# Patient Record
Sex: Female | Born: 1960
Health system: Southern US, Community
[De-identification: ages and names within clinical notes are randomized; demographics above are authoritative.]

## PROBLEM LIST (undated history)

## (undated) DIAGNOSIS — E669 Obesity, unspecified: Secondary | ICD-10-CM

## (undated) DIAGNOSIS — N816 Rectocele: Secondary | ICD-10-CM

## (undated) DIAGNOSIS — R87610 Atypical squamous cells of undetermined significance on cytologic smear of cervix (ASC-US): Secondary | ICD-10-CM

## (undated) DIAGNOSIS — K219 Gastro-esophageal reflux disease without esophagitis: Secondary | ICD-10-CM

## (undated) DIAGNOSIS — L409 Psoriasis, unspecified: Secondary | ICD-10-CM

## (undated) DIAGNOSIS — N879 Dysplasia of cervix uteri, unspecified: Secondary | ICD-10-CM

## (undated) DIAGNOSIS — K760 Fatty (change of) liver, not elsewhere classified: Secondary | ICD-10-CM

## (undated) DIAGNOSIS — F419 Anxiety disorder, unspecified: Secondary | ICD-10-CM

## (undated) DIAGNOSIS — K5792 Diverticulitis of intestine, part unspecified, without perforation or abscess without bleeding: Secondary | ICD-10-CM

## (undated) HISTORY — DX: Atypical squamous cells of undetermined significance on cytologic smear of cervix (ASC-US): R87.610

## (undated) HISTORY — DX: Rectocele: N81.6

## (undated) HISTORY — PX: COLPOSCOPY: SHX161

## (undated) HISTORY — DX: Obesity, unspecified: E66.9

## (undated) HISTORY — DX: Anxiety disorder, unspecified: F41.9

## (undated) HISTORY — PX: COLONOSCOPY: SHX174

## (undated) HISTORY — DX: Dysplasia of cervix uteri, unspecified: N87.9

## (undated) HISTORY — PX: UPPER GASTROINTESTINAL ENDOSCOPY: SHX188

## (undated) HISTORY — DX: Diverticulitis of intestine, part unspecified, without perforation or abscess without bleeding: K57.92

## (undated) HISTORY — DX: Psoriasis, unspecified: L40.9

## (undated) HISTORY — DX: Gastro-esophageal reflux disease without esophagitis: K21.9

## (undated) HISTORY — PX: ROOT CANAL: SHX2363

## (undated) HISTORY — DX: Fatty (change of) liver, not elsewhere classified: K76.0

---

## 1996-02-12 ENCOUNTER — Encounter: Payer: Self-pay | Admitting: Gastroenterology

## 1998-11-17 ENCOUNTER — Other Ambulatory Visit: Admission: RE | Admit: 1998-11-17 | Discharge: 1998-11-17 | Payer: Self-pay | Admitting: Gynecology

## 2000-02-12 ENCOUNTER — Other Ambulatory Visit: Admission: RE | Admit: 2000-02-12 | Discharge: 2000-02-12 | Payer: Self-pay | Admitting: Gynecology

## 2001-02-18 ENCOUNTER — Other Ambulatory Visit: Admission: RE | Admit: 2001-02-18 | Discharge: 2001-02-18 | Payer: Self-pay | Admitting: Gynecology

## 2001-03-25 ENCOUNTER — Other Ambulatory Visit: Admission: RE | Admit: 2001-03-25 | Discharge: 2001-03-25 | Payer: Self-pay | Admitting: Gynecology

## 2001-03-25 ENCOUNTER — Encounter (INDEPENDENT_AMBULATORY_CARE_PROVIDER_SITE_OTHER): Payer: Self-pay | Admitting: Specialist

## 2002-03-12 ENCOUNTER — Other Ambulatory Visit: Admission: RE | Admit: 2002-03-12 | Discharge: 2002-03-12 | Payer: Self-pay | Admitting: Gynecology

## 2003-04-26 ENCOUNTER — Other Ambulatory Visit: Admission: RE | Admit: 2003-04-26 | Discharge: 2003-04-26 | Payer: Self-pay | Admitting: Gynecology

## 2004-04-26 ENCOUNTER — Other Ambulatory Visit: Admission: RE | Admit: 2004-04-26 | Discharge: 2004-04-26 | Payer: Self-pay | Admitting: Gynecology

## 2005-04-30 ENCOUNTER — Other Ambulatory Visit: Admission: RE | Admit: 2005-04-30 | Discharge: 2005-04-30 | Payer: Self-pay | Admitting: Gynecology

## 2006-05-09 ENCOUNTER — Other Ambulatory Visit: Admission: RE | Admit: 2006-05-09 | Discharge: 2006-05-09 | Payer: Self-pay | Admitting: Gynecology

## 2007-05-15 ENCOUNTER — Other Ambulatory Visit: Admission: RE | Admit: 2007-05-15 | Discharge: 2007-05-15 | Payer: Self-pay | Admitting: Gynecology

## 2008-05-18 ENCOUNTER — Other Ambulatory Visit: Admission: RE | Admit: 2008-05-18 | Discharge: 2008-05-18 | Payer: Self-pay | Admitting: Gynecology

## 2008-11-17 ENCOUNTER — Ambulatory Visit: Payer: Self-pay | Admitting: Gynecology

## 2008-11-17 ENCOUNTER — Encounter: Payer: Self-pay | Admitting: Gynecology

## 2008-11-17 ENCOUNTER — Other Ambulatory Visit: Admission: RE | Admit: 2008-11-17 | Discharge: 2008-11-17 | Payer: Self-pay | Admitting: Gynecology

## 2009-05-24 ENCOUNTER — Other Ambulatory Visit: Admission: RE | Admit: 2009-05-24 | Discharge: 2009-05-24 | Payer: Self-pay | Admitting: Gynecology

## 2009-05-24 ENCOUNTER — Encounter: Payer: Self-pay | Admitting: Gastroenterology

## 2009-05-24 ENCOUNTER — Encounter: Payer: Self-pay | Admitting: Gynecology

## 2009-05-24 ENCOUNTER — Ambulatory Visit: Payer: Self-pay | Admitting: Gynecology

## 2009-05-31 ENCOUNTER — Ambulatory Visit: Payer: Self-pay | Admitting: Gynecology

## 2009-05-31 ENCOUNTER — Encounter: Payer: Self-pay | Admitting: Gastroenterology

## 2009-07-15 DIAGNOSIS — K219 Gastro-esophageal reflux disease without esophagitis: Secondary | ICD-10-CM | POA: Insufficient documentation

## 2009-07-15 DIAGNOSIS — R740 Nonspecific elevation of levels of transaminase and lactic acid dehydrogenase [LDH]: Secondary | ICD-10-CM

## 2009-07-15 DIAGNOSIS — K589 Irritable bowel syndrome without diarrhea: Secondary | ICD-10-CM | POA: Insufficient documentation

## 2009-07-15 DIAGNOSIS — R109 Unspecified abdominal pain: Secondary | ICD-10-CM | POA: Insufficient documentation

## 2009-07-15 DIAGNOSIS — R7401 Elevation of levels of liver transaminase levels: Secondary | ICD-10-CM | POA: Insufficient documentation

## 2009-07-15 DIAGNOSIS — K649 Unspecified hemorrhoids: Secondary | ICD-10-CM | POA: Insufficient documentation

## 2009-07-15 DIAGNOSIS — K449 Diaphragmatic hernia without obstruction or gangrene: Secondary | ICD-10-CM | POA: Insufficient documentation

## 2009-07-15 DIAGNOSIS — Z87442 Personal history of urinary calculi: Secondary | ICD-10-CM | POA: Insufficient documentation

## 2009-07-18 ENCOUNTER — Ambulatory Visit: Payer: Self-pay | Admitting: Gastroenterology

## 2009-07-18 DIAGNOSIS — E669 Obesity, unspecified: Secondary | ICD-10-CM | POA: Insufficient documentation

## 2009-07-18 LAB — CONVERTED CEMR LAB
A-1 Antitrypsin, Ser: 165 mg/dL (ref 83–200)
ALT: 76 units/L — ABNORMAL HIGH (ref 0–35)
AST: 60 units/L — ABNORMAL HIGH (ref 0–37)
Albumin: 4.1 g/dL (ref 3.5–5.2)
Alkaline Phosphatase: 94 units/L (ref 39–117)
Anti Nuclear Antibody(ANA): NEGATIVE
BUN: 9 mg/dL (ref 6–23)
Basophils Absolute: 0 10*3/uL (ref 0.0–0.1)
Basophils Relative: 0.6 % (ref 0.0–3.0)
Bilirubin, Direct: 0.2 mg/dL (ref 0.0–0.3)
CO2: 31 meq/L (ref 19–32)
Calcium: 9.4 mg/dL (ref 8.4–10.5)
Ceruloplasmin: 36 mg/dL (ref 21–63)
Chloride: 105 meq/L (ref 96–112)
Cholesterol: 147 mg/dL (ref 0–200)
Creatinine, Ser: 0.6 mg/dL (ref 0.4–1.2)
Eosinophils Absolute: 0.1 10*3/uL (ref 0.0–0.7)
Eosinophils Relative: 1.7 % (ref 0.0–5.0)
Ferritin: 87.2 ng/mL (ref 10.0–291.0)
Folate: 10 ng/mL
GFR calc non Af Amer: 114.24 mL/min (ref >60)
Glucose, Bld: 97 mg/dL (ref 70–99)
HCT: 41.3 % (ref 36.0–46.0)
HDL: 39.5 mg/dL (ref >39.00)
Hemoglobin: 14.4 g/dL (ref 12.0–15.0)
Iron: 56 ug/dL (ref 42–145)
LDL Cholesterol: 96 mg/dL (ref 0–99)
Lymphocytes Relative: 28.7 % (ref 12.0–46.0)
Lymphs Abs: 1.6 10*3/uL (ref 0.7–4.0)
MCHC: 34.8 g/dL (ref 30.0–36.0)
MCV: 82.5 fL (ref 78.0–100.0)
Monocytes Absolute: 0.4 10*3/uL (ref 0.1–1.0)
Monocytes Relative: 6.6 % (ref 3.0–12.0)
Neutro Abs: 3.5 10*3/uL (ref 1.4–7.7)
Neutrophils Relative %: 62.4 % (ref 43.0–77.0)
Platelets: 161 10*3/uL (ref 150.0–400.0)
Potassium: 4.6 meq/L (ref 3.5–5.1)
RBC: 5.01 M/uL (ref 3.87–5.11)
RDW: 14.4 % (ref 11.5–14.6)
Saturation Ratios: 15.9 % — ABNORMAL LOW (ref 20.0–50.0)
Sed Rate: 17 mm/hr (ref 0–22)
Sodium: 141 meq/L (ref 135–145)
TSH: 2.16 microintl units/mL (ref 0.35–5.50)
Total Bilirubin: 1.1 mg/dL (ref 0.3–1.2)
Total CHOL/HDL Ratio: 4
Total Protein: 7.7 g/dL (ref 6.0–8.3)
Transferrin: 250.8 mg/dL (ref 212.0–360.0)
Triglycerides: 56 mg/dL (ref 0.0–149.0)
VLDL: 11.2 mg/dL (ref 0.0–40.0)
Vitamin B-12: 479 pg/mL (ref 211–911)
WBC: 5.6 10*3/uL (ref 4.5–10.5)

## 2009-07-19 ENCOUNTER — Ambulatory Visit (HOSPITAL_COMMUNITY): Admission: RE | Admit: 2009-07-19 | Discharge: 2009-07-19 | Payer: Self-pay | Admitting: Gastroenterology

## 2009-07-27 ENCOUNTER — Encounter: Payer: Self-pay | Admitting: Gastroenterology

## 2009-07-27 ENCOUNTER — Ambulatory Visit: Payer: Self-pay | Admitting: Gastroenterology

## 2009-07-29 ENCOUNTER — Encounter: Payer: Self-pay | Admitting: Gastroenterology

## 2009-08-01 ENCOUNTER — Telehealth: Payer: Self-pay | Admitting: Gastroenterology

## 2009-08-12 DIAGNOSIS — K5730 Diverticulosis of large intestine without perforation or abscess without bleeding: Secondary | ICD-10-CM | POA: Insufficient documentation

## 2009-08-12 DIAGNOSIS — K7689 Other specified diseases of liver: Secondary | ICD-10-CM | POA: Insufficient documentation

## 2009-08-16 ENCOUNTER — Ambulatory Visit: Payer: Self-pay | Admitting: Gastroenterology

## 2009-09-01 ENCOUNTER — Encounter: Admission: RE | Admit: 2009-09-01 | Discharge: 2009-11-30 | Payer: Self-pay | Admitting: Gastroenterology

## 2009-10-11 ENCOUNTER — Telehealth (INDEPENDENT_AMBULATORY_CARE_PROVIDER_SITE_OTHER): Payer: Self-pay | Admitting: *Deleted

## 2010-06-21 ENCOUNTER — Other Ambulatory Visit: Admission: RE | Admit: 2010-06-21 | Discharge: 2010-06-21 | Payer: Self-pay | Admitting: Gynecology

## 2010-06-21 ENCOUNTER — Ambulatory Visit: Payer: Self-pay | Admitting: Gynecology

## 2010-06-21 ENCOUNTER — Encounter: Payer: Self-pay | Admitting: Gastroenterology

## 2010-07-12 ENCOUNTER — Ambulatory Visit: Payer: Self-pay | Admitting: Gynecology

## 2010-09-12 ENCOUNTER — Ambulatory Visit: Payer: Self-pay | Admitting: Gynecology

## 2011-01-16 NOTE — Procedures (Signed)
Summary: EGD   EGD  Procedure date:  07/27/2009  Findings:      Location: [facility]   ENDOSCOPY PROCEDURE REPORT  PATIENT:  Grace Wells, Grace Wells  MR#:  [id number] BIRTHDATE:   1961/01/13, 48 yrs. old   GENDER:   female  ENDOSCOPIST:   Vania Rea. Jarold Motto, MD, [facility] Referred by:   PROCEDURE DATE:  07/27/2009 PROCEDURE:  EGD with biopsy ASA CLASS:   Class II INDICATIONS: chest pain, GERD   MEDICATIONS:    Versed 1 mg IV, glycopyrrolate (Robinal) 0.2 mg IV TOPICAL ANESTHETIC:   Exactacain Spray  DESCRIPTION OF PROCEDURE:   After the risks benefits and alternatives of the procedure were thoroughly explained, informed consent was obtained.  The [facility] [id number] [id number] endoscope was introduced through the mouth and advanced to the second portion of the duodenum, without limitations.  The instrument was slowly withdrawn as the mucosa was fully examined. <<PROCEDUREIMAGES>>  <<OLD IMAGES>>  Multiple erosions were found in the distal esophagus.  A hiatal hernia was found.  The stomach was entered and closely examined. The antrum, angularis, and lesser curvature were well visualized, including a retroflexed view of the cardia and fundus. The stomach wall was normally distensable. The scope passed easily through the pylorus into the duodenum.  The duodenal bulb was normal in appearance, as was the postbulbar duodenum. SMALL BOWEL BIOPSY DONE.    Retroflexed views revealed a hiatal hernia.    The scope was then withdrawn from the patient and the procedure completed.  COMPLICATIONS:   None  ENDOSCOPIC IMPRESSION:  1) Erosions, multiple in the distal esophagus  2) Hiatal hernia  3) Normal stomach  4) Normal duodenum  CHRONIC GERD. RECOMMENDATIONS:  1) await biopsy results  2) continue current medications  REPEAT EXAM:   No   _______________________________ Vania Rea. Jarold Motto, MD, [facility]    CC: Colin Broach, MD        REPORT OF SURGICAL PATHOLOGY   Case  #: [id number] Patient Name: Grace Wells, Grace Wells. Office Chart Number:  N/A MRN: [id number] Pathologist: Ferd Hibbs. Colonel Bald, MD DOB/Age  [birth year] (Age: 65)    Gender: F Date Taken:  07/27/2009 Date Received: 07/28/2009   FINAL DIAGNOSIS   ***MICROSCOPIC EXAMINATION AND DIAGNOSIS***   SMALL INTESTINE, BIOPSY:   - BENIGN SMALL BOWEL MUCOSA.   - NO VILLOUS ATROPHY, INFLAMMATION OR OTHER ABNORMALITIES PRESENT.   mw Date Reported:  07/29/2009     Ivin Booty B. Colonel Bald, MD *** Electronically Signed Out By [facility] ***     July 29, 2009 MRN: [id number]    Grace Wells [address] [place], [place]  [address]    Dear Ms. Becvar,  I am pleased to inform you that the biopsies taken during your recent endoscopic examination did not show any evidence of cancer upon pathologic examination.There is no biopsy evidence of celiac disease.  Additional information/recommendations:  __No further action is needed at this time.  Please follow-up with      your primary care physician for your other healthcare needs.  __ Please call [phone] to schedule a return visit to review      your condition.  xx__ Continue with the treatment plan as outlined on the day of your      exam.  __ You should have a repeat endoscopic examination for this problem              in _ months/years.   Please call us if you are having persistent problems or have questions  about your condition that have not been fully answered at this time.  Sincerely,  Mardella Layman MD [facility]  This letter has been electronically signed by your physician.    This report was created from the original endoscopy report, which was reviewed and signed by the above listed endoscopist.

## 2011-01-16 NOTE — Assessment & Plan Note (Signed)
Summary: elevated lfts..em   History of Present Illness Visit Type: Initial Consult Primary GI MD: Sheryn Bison MD FACP FAGA Primary Charron Coultas: Colin Broach, MD Chief Complaint: Patient here for further evaluation of increased LFT's. She complains of some occasional llq abdominal pain as well as some recent increased belching, bloating and reflux symptoms. History of Present Illness:   This patient is a 50 year old Caucasian female referred by Dr. Audie Box for abnormal liver function tests discovered on routine metabolic profile.  Grace Wells has a long history of GERD with previous endoscopy greater than 10 years ago showing erosive esophagitis. She continues with reflux symptoms of regurgitation, burning substernal chest pain, painful swallowing and intermittent solid food dysphagia. She also complains of alternating diarrhea and constipation with crampy lower bowel discomfort in both the right and left lower quadrants lasting several hours occurring very periodically without associated rectal bleeding or melena.  She had a long history of obesity and denies any specific food intolerances. She also has a long history of depression and is on daily Zoloft. She denies a history of hepatitis and recent hepatitis A ,B., and C. serologies were negative. Her SGOT was 45 and SGPT 63 with otherwise normal liver enzymes. She denies right upper quadrant pain or any hepatobiliary complaints or history of known liver disease. She denies abuse of alcohol, cigarettes, or NSAIDs.  Her family history is remarkable for multiple carcinomas with her father having had lung cancer, grandmother with possible colon cancer, and her mother with lymphoma. She did have a flexible sigmoidoscopy that was -13 years ago. I cannot see a CBC with her recent labs. She has no history of collagen vascular disease or a systemic complaints such as fever, chills, skin rashes, pruritus, chronic fatigue.   GI Review of Systems   Reports abdominal pain, chest pain, dysphagia with solids, heartburn, and  weight gain.     Location of  Abdominal pain: lower abdomen.    Denies acid reflux, belching, bloating, dysphagia with liquids, loss of appetite, nausea, vomiting, vomiting blood, and  weight loss.      Reports change in bowel habits.     Denies anal fissure, black tarry stools, constipation, diarrhea, diverticulosis, fecal incontinence, heme positive stool, hemorrhoids, irritable bowel syndrome, jaundice, light color stool, liver problems, rectal bleeding, and  rectal pain.    Current Medications (verified): 1)  Mirena 20 Mcg/24hr Iud (Levonorgestrel) .... Per Gyn 2)  Zoloft 50 Mg Tabs (Sertraline Hcl) .... Take One By Mouth Once Daily  Allergies (verified): 1)  ! Codeine 2)  ! Penicillin  Past History:  Past medical, surgical, family and social histories (including risk factors) reviewed for relevance to current acute and chronic problems.  Past Medical History: Reviewed history from 07/15/2009 and no changes required. Current Problems:  ABDOMINAL PAIN, LOWER (ICD-789.09) NEPHROLITHIASIS, HX OF (ICD-V13.01) CARCINOMA, COLON, FAMILY HX (ICD-V16.0) TRANSAMINASES, SERUM, ELEVATED (ICD-790.4) HEMORRHOIDS (ICD-455.6) IRRITABLE BOWEL SYNDROME (ICD-564.1) GERD (ICD-530.81) HIATAL HERNIA (ICD-553.3)  Past Surgical History: Unremarkable  Family History: Reviewed history from 07/15/2009 and no changes required. Family History of Colon Cancer:Paternal Grandmother  Family History of Liver Cancer:Paternal Grandmother Family History of Cervical Cancer: Sister Family History of Diabetes: Maternal grandmother Family History of Hodgkins Lymphoma: Mother  Social History: Reviewed history from 07/15/2009 and no changes required. Patient has never smoked.  Alcohol Use - no Daily Caffeine Use-3 cups daily Illicit Drug Use - no Patient does not get regular exercise.  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  Review  of Systems  The patient denies  allergy/sinus, anemia, anxiety-new, arthritis/joint pain, back pain, blood in urine, breast changes/lumps, change in vision, confusion, cough, coughing up blood, depression-new, fainting, fatigue, fever, headaches-new, hearing problems, heart murmur, heart rhythm changes, itching, menstrual pain, muscle pains/cramps, night sweats, nosebleeds, pregnancy symptoms, shortness of breath, skin rash, sleeping problems, sore throat, swelling of feet/legs, swollen lymph glands, thirst - excessive , urination - excessive , urination changes/pain, urine leakage, vision changes, and voice change.    Vital Signs:  Patient profile:   50 year old female Height:      65 inches Weight:      260.50 pounds BMI:     43.51 BSA:     2.22 Pulse rate:   72 / minute Pulse rhythm:   regular BP sitting:   126 / 90  (right arm)  Vitals Entered By: Hortense Ramal CMA Duncan Dull) (July 18, 2009 9:06 AM)  Physical Exam  General:  Well developed, well nourished, no acute distress.obese.   Head:  Normocephalic and atraumatic. Eyes:  PERRLA, no icterus.exam deferred to patient's ophthalmologist.   Neck:  Supple; no masses or thyromegaly. Lungs:  Clear throughout to auscultation. Heart:  Regular rate and rhythm; no murmurs, rubs,  or bruits. Abdomen:  Soft, nontender and nondistended. No masses, hepatosplenomegaly or hernias noted. Normal bowel sounds.obese.   Msk:  Symmetrical with no gross deformities. Normal posture. Pulses:  Normal pulses noted. Extremities:  No clubbing, cyanosis, edema or deformities noted. Neurologic:  Alert and  oriented x4;  grossly normal neurologically. Cervical Nodes:  No significant cervical adenopathy. Inguinal Nodes:  No significant inguinal adenopathy. Psych:  Alert and cooperative. Normal mood and affect.   Impression & Recommendations:  Problem # 1:  TRANSAMINASES, SERUM, ELEVATED (ICD-790.4) Assessment Unchanged Her transaminase pattern and negative  hepatitis serologies are most consistent with fatty infiltration of her liver associated with her obesity. Have scheduled her for upper abdominal ultrasound exam and also other laboratory parameters to exclude metabolic liver disease. I suspect she will need dietary referral for a scheduled patterned weight loss reduction program along with a physical exercise program. If this is not possible, consideration for bariatric surgery should be entertained since her obesity is contributing to multiple problems. Orders: TLB-CBC Platelet - w/Differential (85025-CBCD) TLB-BMP (Basic Metabolic Panel-BMET) (80048-METABOL) TLB-Hepatic/Liver Function Pnl (80076-HEPATIC) TLB-TSH (Thyroid Stimulating Hormone) (84443-TSH) TLB-B12, Serum-Total ONLY (16109-U04) TLB-Ferritin (82728-FER) TLB-Folic Acid (Folate) (82746-FOL) TLB-IBC Pnl (Iron/FE;Transferrin) (83550-IBC) TLB-Lipid Panel (80061-LIPID) TLB-Sedimentation Rate (ESR) (85652-ESR) T-Alpha-1-Antitrypsin Tot [phone]) T-AMA [phone]) T-Anti SMA (86578-46962) T-ANA (95284-13244) T-Ceruloplasmin (01027-25366) Ultrasound Abdomen (UAS) Colon/Endo (Colon/Endo)  Problem # 2:  OBESITY (ICD-278.00) Assessment: Deteriorated Currently, the patient relates that she is going to undergo dietary training and counseling at her church. If she has Elita Boone syndrome, weight loss will be important and will also consider low-dose Actos prescription and vitamin E THERAPY. D Orders: TLB-CBC Platelet - w/Differential (85025-CBCD) TLB-BMP (Basic Metabolic Panel-BMET) (80048-METABOL) TLB-Hepatic/Liver Function Pnl (80076-HEPATIC) TLB-TSH (Thyroid Stimulating Hormone) (84443-TSH) TLB-B12, Serum-Total ONLY (44034-V42) TLB-Ferritin (82728-FER) TLB-Folic Acid (Folate) (82746-FOL) TLB-IBC Pnl (Iron/FE;Transferrin) (83550-IBC) TLB-Lipid Panel (80061-LIPID) TLB-Sedimentation Rate (ESR) (85652-ESR) T-Alpha-1-Antitrypsin Tot [phone]) T-AMA [phone]) T-Anti SMA  (66063-01601) T-ANA (09323-55732) T-Ceruloplasmin (20254-27062) Ultrasound Abdomen (UAS) Colon/Endo (Colon/Endo)  Problem # 3:  ABDOMINAL PAIN, LOWER (ICD-789.09) Assessment: Deteriorated She has rather classic herbal bowel syndrome and I have given her Levsin 0.125 mg to use p.r.n. Because of her family history of colon cancer multiple carcinomas, and also scheduled colonoscopy exam at her convenience. Orders: TLB-CBC Platelet - w/Differential (85025-CBCD) TLB-BMP (Basic Metabolic Panel-BMET) (  80048-METABOL) TLB-Hepatic/Liver Function Pnl (80076-HEPATIC) TLB-TSH (Thyroid Stimulating Hormone) (84443-TSH) TLB-B12, Serum-Total ONLY (82956-O13) TLB-Ferritin (82728-FER) TLB-Folic Acid (Folate) (82746-FOL) TLB-IBC Pnl (Iron/FE;Transferrin) (83550-IBC) TLB-Lipid Panel (80061-LIPID) TLB-Sedimentation Rate (ESR) (85652-ESR) T-Alpha-1-Antitrypsin Tot [phone]) T-AMA [phone]) T-Anti SMA (40102-72536) T-ANA (64403-47425) T-Ceruloplasmin (95638-75643) Ultrasound Abdomen (UAS) Colon/Endo (Colon/Endo)  Problem # 4:  GERD (ICD-530.81) Assessment: Deteriorated She has rather impressive GERD symptoms and also painful swallowing and dysphagia suggesting ulcerative esophagitis and stricture formation. I placed her on daily PPI therapy and strict antireflux maneuvers. Obviously her obesity is contributing to this current problem. Orders: TLB-CBC Platelet - w/Differential (85025-CBCD) TLB-BMP (Basic Metabolic Panel-BMET) (80048-METABOL) TLB-Hepatic/Liver Function Pnl (80076-HEPATIC) TLB-TSH (Thyroid Stimulating Hormone) (84443-TSH) TLB-B12, Serum-Total ONLY (32951-O84) TLB-Ferritin (82728-FER) TLB-Folic Acid (Folate) (82746-FOL) TLB-IBC Pnl (Iron/FE;Transferrin) (83550-IBC) TLB-Lipid Panel (80061-LIPID) TLB-Sedimentation Rate (ESR) (85652-ESR) T-Alpha-1-Antitrypsin Tot [phone]) T-AMA [phone]) T-Anti SMA (22025-42706) T-ANA (23762-83151) T-Ceruloplasmin  (76160-73710) Ultrasound Abdomen (UAS) Colon/Endo (Colon/Endo)  Problem # 5:  CARCINOMA, COLON, FAMILY HX (ICD-V16.0) Assessment: Unchanged  Problem # 6:  NEPHROLITHIASIS, HX OF (ICD-V13.01) Assessment: Unchanged  Patient Instructions: 1)  Copy sent to : Dr. Colin Broach in gynecology 2)  Avoid foods high in acid content ( tomatoes, citrus juices, spicy foods) . Avoid eating within 3 to 4 hours of lying down or before exercising. Do not over eat; try smaller more frequent meals. Elevate head of bed four inches when sleeping.  3)  Colonoscopy and Flexible Sigmoidoscopy brochure given.  4)  Conscious Sedation brochure given.  5)  Upper Endoscopy brochure given.  6)  AcipHex 20 mg a day before breakfast 7)  P.r.n. Levsin used for IBS 8)  Upper abdominal ultrasound exam scheduled 9)  Laboratory parameters pending. 10)  Please continue current medications.  Prescriptions: ACIPHEX 20 MG  TBEC (RABEPRAZOLE SODIUM) Take 1 each day 30 minutes before meals  #30 x 3   Entered by:   Harlow Mares CMA (AAMA)   Authorized by:   Mardella Layman MD FACG,FAGA   Signed by:   Harlow Mares CMA (AAMA) on 07/18/2009   Method used:   Electronically to        [facility]  [address] [id number]       [address]       [place], [place]  [address]       Ph: [phone] or [phone]       Fax: [phone]   RxID:   [id number] MOVIPREP 100 GM  SOLR (PEG-KCL-NACL-NASULF-NA ASC-C) As per prep instructions.  #1 x 0   Entered by:   Harlow Mares CMA (AAMA)   Authorized by:   Mardella Layman MD FACG,FAGA   Signed by:   Harlow Mares CMA (AAMA) on 07/18/2009   Method used:   [facility] to        [facility]  [address] [id number]       [address]       [place], [place]  [address]       Ph: [phone] or [phone]       Fax: [phone]   RxID:   [id number] LEVSIN 0.125 MG  TABS (HYOSCYAMINE SULFATE) take one by mouth as needed  #90 x  3   Entered by:   Harlow Mares CMA (AAMA)   Authorized by:   Mardella Layman MD FACG,FAGA   Signed by:   Harlow Mares CMA (AAMA) on 07/18/2009   Method used:   [facility]  [address] [id number]       [address]       [place], [place]  [address]       Ph: [phone] or [phone]       Fax: [phone]   RxID:   [id number]

## 2011-01-16 NOTE — Procedures (Signed)
Summary: Colonoscopy   Colonoscopy  Procedure date:  07/27/2009  Findings:      [facility]    Procedures Next Due Date:    Colonoscopy: 07/2014 COLONOSCOPY PROCEDURE REPORT  PATIENT:  Grace, Wells  MR#:  [id number] BIRTHDATE:   [birth year], 48 yrs. old   GENDER:   female  ENDOSCOPIST:   Vania Rea. Jarold Motto, MD, [facility] Referred by:   PROCEDURE DATE:  07/27/2009 PROCEDURE:  Colonoscopy, diagnostic ASA CLASS:   Class II INDICATIONS: family history of colon cancer   MEDICATIONS:    Fentanyl 50 mcg IV, Versed 7 mg  DESCRIPTION OF PROCEDURE:   After the risks benefits and alternatives of the procedure were thoroughly explained, informed consent was obtained.  Digital rectal exam was performed and revealed no abnormalities.   The LB [id number] endoscope was introduced through the anus and advanced to the cecum, which was identified by both the appendix and ileocecal valve, without limitations.  The quality of the prep was excellent, using MoviPrep.  The instrument was then slowly withdrawn as the colon was fully examined. <<PROCEDUREIMAGES>>    <<OLD IMAGES>>  FINDINGS:  Moderate diverticulosis was found sigmoid to descending  No polyps or cancers were seen.  This was otherwise a normal examination of the colon.   Retroflexed views in the rectum revealed no abnormalities.    The scope was then withdrawn from the patient and the procedure completed.  COMPLICATIONS:   None  ENDOSCOPIC IMPRESSION:  1) Moderate diverticulosis in the sigmoid to descending  2) No polyps or cancers  3) Otherwise normal examination RECOMMENDATIONS:  1) high fiber diet  2) Given your significant family history of colon cancer, you should have a repeat colonoscopy in 5 years  REPEAT EXAM:   No   _______________________________ Vania Rea. Jarold Motto, MD, [facility]  CC: Colin Broach, MD

## 2011-01-16 NOTE — Letter (Signed)
Summary: Patient [facility] Biopsy Results  [facility]  [address] [place], [place] [address]   Phone: [phone]  Fax: [phone]        July 29, 2009 MRN: [id number]    JANEECE BLOK [address] [place], [place]  [address]    Dear Ms. Espino,  I am pleased to inform you that the biopsies taken during your recent endoscopic examination did not show any evidence of cancer upon pathologic examination.There is no biopsy evidence of celiac disease.  Additional information/recommendations:  __No further action is needed at this time.  Please follow-up with      your primary care physician for your other healthcare needs.  __ Please call [phone] to schedule a return visit to review      your condition.  xx__ Continue with the treatment plan as outlined on the day of your      exam.  __ You should have a repeat endoscopic examination for this problem              in _ months/years.   Please call us if you are having persistent problems or have questions about your condition that have not been fully answered at this time.  Sincerely,  Mardella Layman MD [facility]  This letter has been electronically signed by your physician.

## 2011-01-16 NOTE — Progress Notes (Signed)
Summary: ? Aciphex Reaction and Change in medication  Phone Note Call from Patient   Caller: Patient Call For: Dr Jarold Motto Summary of Call: Spoke with patient regarding her medication. She has an itchy rash and thinks it could be due to her aciphex rx. She states that she has done nothing different and is only taking the aciphex rx given to her. She has not taken any of the bentyl given to her. I have advised patient to take benadryl for the hives and to let us know immediately if symptoms become worse. She was also told to discontinue aciphex and start on omeprazole instead (her insurance will not cover aciphex-she has been taking samples provided by [place].) Initial call taken by: Hortense Ramal CMA Duncan Dull),  August 01, 2009 9:16 AM    New/Updated Medications: PRILOSEC 40 MG CPDR (OMEPRAZOLE) Take 1 tablet by mouth 30 minutes before breakfast meal. Prescriptions: PRILOSEC 40 MG CPDR (OMEPRAZOLE) Take 1 tablet by mouth 30 minutes before breakfast meal.  #30 x 6   Entered by:   Hortense Ramal CMA (AAMA)   Authorized by:   Mardella Layman MD FACG,FAGA   Signed by:   Hortense Ramal CMA (AAMA) on 08/01/2009   Method used:   [facility]  [address] [id number]       [address]       [place], [place]  [address]       Ph: [phone] or [phone]       Fax: [phone]   RxID:   [id number]

## 2011-01-16 NOTE — Progress Notes (Signed)
Summary: Needs OV  Phone Note Outgoing Call   Call placed by: Ashok Cordia RN,  October 11, 2009 9:39 AM Summary of Call: Left message for pt to call.  Needs F/U appt with liver profile 2-3 mo f/u.  Follow-up for Phone Call        Left message for pt to call. Ashok Cordia RN  October 13, 2009 10:16 AM  Letter sent.  Unable to reach pt by phone. Follow-up by: Ashok Cordia RN,  October 18, 2009 9:13 AM

## 2011-01-16 NOTE — Procedures (Signed)
Summary: Egd and Flex  Egd and Flex   Imported By: Harlow Mares CMA (AAMA) 07/15/2009 16:02:39  _____________________________________________________________________  External Attachment:    Type:   Image     Comment:   External Document

## 2011-01-16 NOTE — Assessment & Plan Note (Signed)
Summary: follow up ECL/lk   History of Present Illness Visit Type: Follow-up Visit Primary GI MD: Sheryn Bison MD FACP FAGA Primary Provider: Colin Broach, MD Chief Complaint: f/u from [facility]  No current GI complaints History of Present Illness:   Grace Wells had endoscopy showed erosive gastritis and she currently is on Prilosec 40 mg a day and is fairly asymptomatic. Colonoscopy was unremarkable. Ultrasound exam is consistent fatty infiltration of her liver and she continues with mild elevation of her liver function tests with her transaminase levels approximately twice normal. She denies a specific epidural or complaints. She has suffered from chronic obesity. Metabolic liver workup otherwise was negative. Family history is noncontributory.   GI Review of Systems      Denies abdominal pain, acid reflux, belching, bloating, chest pain, dysphagia with liquids, dysphagia with solids, heartburn, loss of appetite, nausea, vomiting, vomiting blood, weight loss, and  weight gain.        Denies anal fissure, black tarry stools, change in bowel habit, constipation, diarrhea, diverticulosis, fecal incontinence, heme positive stool, hemorrhoids, irritable bowel syndrome, jaundice, light color stool, liver problems, rectal bleeding, and  rectal pain.    Current Medications (verified): 1)  Mirena 20 Mcg/24hr Iud (Levonorgestrel) .... Per Gyn 2)  Zoloft 50 Mg Tabs (Sertraline Hcl) .... Take One By Mouth Once Daily 3)  Levsin 0.125 Mg  Tabs (Hyoscyamine Sulfate) .... Take One By Mouth As Needed 4)  Prilosec 40 Mg Cpdr (Omeprazole) .... Take 1 Tablet By Mouth 30 Minutes Before Breakfast Meal.  Allergies (verified): 1)  ! Codeine 2)  ! Penicillin  Past History:  Past medical, surgical, family and social histories (including risk factors) reviewed for relevance to current acute and chronic problems.  Past Medical History: Reviewed history from 08/12/2009 and no changes required. Current  Problems:  DIVERTICULOSIS, COLON (ICD-562.10) FATTY LIVER DISEASE (ICD-571.8) OBESITY (ICD-278.00) ABDOMINAL PAIN, LOWER (ICD-789.09) NEPHROLITHIASIS, HX OF (ICD-V13.01) CARCINOMA, COLON, FAMILY HX (ICD-V16.0) TRANSAMINASES, SERUM, ELEVATED (ICD-790.4) HEMORRHOIDS (ICD-455.6) IRRITABLE BOWEL SYNDROME (ICD-564.1) GERD (ICD-530.81) HIATAL HERNIA (ICD-553.3)  Past Surgical History: Reviewed history from 07/18/2009 and no changes required. Unremarkable  Family History: Reviewed history from 07/18/2009 and no changes required. Family History of Colon Cancer:Paternal Grandmother  Family History of Liver Cancer:Paternal Grandmother Family History of Cervical Cancer: Sister Family History of Diabetes: Maternal grandmother Family History of Hodgkins Lymphoma: Mother  Social History: Reviewed history from 07/18/2009 and no changes required. Patient has never smoked.  Alcohol Use - no Daily Caffeine Use-3 cups daily Illicit Drug Use - no Patient does not get regular exercise.  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  Review of Systems  The patient denies allergy/sinus, anemia, anxiety-new, arthritis/joint pain, back pain, blood in urine, breast changes/lumps, change in vision, confusion, cough, coughing up blood, depression-new, fainting, fatigue, fever, headaches-new, hearing problems, heart murmur, heart rhythm changes, itching, menstrual pain, muscle pains/cramps, night sweats, nosebleeds, pregnancy symptoms, shortness of breath, skin rash, sleeping problems, sore throat, swelling of feet/legs, swollen lymph glands, thirst - excessive , urination - excessive , urination changes/pain, urine leakage, vision changes, and voice change.    Vital Signs:  Patient profile:   50 year old female Height:      65 inches Weight:      259.50 pounds BMI:     43.34 Pulse rate:   70 / minute Pulse rhythm:   regular BP sitting:   110 / 66  (right arm)  Vitals Entered By: Chales Abrahams CMA Duncan Dull)  (August 16, 2009 2:43 PM)  Physical  Exam  General:  Well developed, well nourished, no acute distress.healthy appearing and obese.   Head:  Normocephalic and atraumatic. Eyes:  PERRLA, no icterus.exam deferred to patient's ophthalmologist.   Psych:  Alert and cooperative. Normal mood and affect.   Impression & Recommendations:  Problem # 1:  FATTY LIVER DISEASE (ICD-571.8) Assessment Unchanged I have referred her to [facility] nutritional center for weight loss reduction have asked her to enroll in an exercise program. Hopefully she will be able to lose 10% of her body weight and we will recheck her liver function tests. She may need vitamin E and Actos therapy. She also may need percutaneous liver biopsy to exclude Nash syndrome and cirrhosis. I will see her back in 2 months time and repeat her liver profile at that time. Orders: [facility] ([facility])  Problem # 2:  DIVERTICULOSIS, COLON (ICD-562.10) Assessment: Improved high-fiber diet as tolerated with caloric restriction.  Problem # 3:  CARCINOMA, COLON, FAMILY HX (ICD-V16.0) Assessment: Unchanged Colonoscopy every 5 years as per clinical protocol  Problem # 4:  GERD (ICD-530.81) Assessment: Improved Continue reflux regime and daily PPI therapy.  Patient Instructions: 1)  Please continue current medications.  2)  Discussed importance of regular exercise and recommended starting or continuing a regular exercise program for good health.  3)  Fatty Liver handout given.  4)  Copy sent to : Dr. Colin Broach 5)  Dietary referral for weight loss 6)  Please schedule a follow-up appointment in 2 months.  7)  Liver profile before next clinic visit 8)  Continue anti-reflex regime

## 2011-07-12 ENCOUNTER — Other Ambulatory Visit: Payer: Self-pay | Admitting: Gynecology

## 2011-07-12 ENCOUNTER — Encounter: Payer: Self-pay | Admitting: Gynecology

## 2011-07-13 ENCOUNTER — Other Ambulatory Visit: Payer: Self-pay | Admitting: *Deleted

## 2011-07-13 DIAGNOSIS — F419 Anxiety disorder, unspecified: Secondary | ICD-10-CM

## 2011-07-13 MED ORDER — SERTRALINE HCL 50 MG PO TABS: 50.0000 mg | ORAL_TABLET | Freq: Every day | ORAL | Status: DC

## 2011-07-13 NOTE — Telephone Encounter (Signed)
Needs annual exam

## 2011-07-13 NOTE — Telephone Encounter (Signed)
PT WOULD LIKE A COUPLE OF PILLS TO LAST HER UNTIL HER APPOINTMENT.IT IS ACTUALLY ON 07/17/11 RATHER THAN 08/17/11.

## 2011-07-13 NOTE — Telephone Encounter (Signed)
PT HAS APPT. ON 08/17/11. SHE IS REQUESTING 1 REFILL IF OK PLEASE E-SCRIBE.

## 2011-07-17 ENCOUNTER — Other Ambulatory Visit (HOSPITAL_COMMUNITY)
Admission: RE | Admit: 2011-07-17 | Discharge: 2011-07-17 | Disposition: A | Discharge status: Home / Self Care | Payer: Federal, State, Local not specified - PPO | Source: Ambulatory Visit | Attending: Gynecology | Admitting: Gynecology

## 2011-07-17 ENCOUNTER — Encounter: Payer: Self-pay | Admitting: Gynecology

## 2011-07-17 ENCOUNTER — Ambulatory Visit (INDEPENDENT_AMBULATORY_CARE_PROVIDER_SITE_OTHER): Payer: Federal, State, Local not specified - PPO | Admitting: Gynecology

## 2011-07-17 VITALS — BP 112/76 | Ht 65.25 in | Wt 263.0 lb

## 2011-07-17 DIAGNOSIS — F419 Anxiety disorder, unspecified: Secondary | ICD-10-CM

## 2011-07-17 DIAGNOSIS — R82998 Other abnormal findings in urine: Secondary | ICD-10-CM

## 2011-07-17 DIAGNOSIS — Z13220 Encounter for screening for lipoid disorders: Secondary | ICD-10-CM

## 2011-07-17 DIAGNOSIS — Z131 Encounter for screening for diabetes mellitus: Secondary | ICD-10-CM

## 2011-07-17 DIAGNOSIS — F411 Generalized anxiety disorder: Secondary | ICD-10-CM

## 2011-07-17 DIAGNOSIS — K759 Inflammatory liver disease, unspecified: Secondary | ICD-10-CM

## 2011-07-17 DIAGNOSIS — Z01419 Encounter for gynecological examination (general) (routine) without abnormal findings: Secondary | ICD-10-CM | POA: Insufficient documentation

## 2011-07-17 LAB — HEPATIC FUNCTION PANEL
ALT: 47 U/L — ABNORMAL HIGH (ref 0–35)
AST: 36 U/L (ref 0–37)
Albumin: 4.4 g/dL (ref 3.5–5.2)
Alkaline Phosphatase: 84 U/L (ref 39–117)
Bilirubin, Direct: 0.3 mg/dL (ref 0.0–0.3)
Indirect Bilirubin: 0.8 mg/dL (ref 0.0–0.9)
Total Bilirubin: 1.1 mg/dL (ref 0.3–1.2)
Total Protein: 7.2 g/dL (ref 6.0–8.3)

## 2011-07-17 MED ORDER — SERTRALINE HCL 50 MG PO TABS: 50.0000 mg | ORAL_TABLET | Freq: Every day | ORAL | Status: DC

## 2011-07-17 NOTE — Progress Notes (Signed)
Grace Wells 02-17-1961 [id number]   History:    50 y.o.  for annual exam overall doing well. Mirena IUD placed 7 2011 amenorrheic.  Was evaluated for mild elevated LFTs by GI she said she had endoscopy/colonoscopy but she had a followup  Lab that was normal but she's not sure.  Is taking Zoloft for a anxiety doing well with this and requests a refill.  Past medical history,surgical history, family history and social history were all reviewed and documented in the EPIC chart. ROS:  Was performed and pertinent positives and negatives are included in the history.  Exam: chaperone present Filed Vitals:   07/17/11 1105  BP: 112/76   General appearance  Normal Skin grossly normal Head/Neck normal with no cervical or supraclavicular adenopathy thyroid normal Lungs  clear Cardiac RR, without RMG Abdominal  soft, nontender, without masses, guarding, rebound or organomegaly  Breasts  examined lying and sitting without masses, retractions, discharge or axillary adenopathy. Pelvic  Ext/BUS/vagina  Normal, with mild pelvic relaxation  Cervix  Normal, IUD string visualized  Uterus  anteverted, normal size, shape and contour, midline and mobile nontender, mild uterine prolapse noted  Adnexa  Without masses or tenderness  Anus and perineum  normal   Rectovaginal  normal sphincter tone without palpated masses or tenderness. Mild rectocele noted   Assessment/Plan:  50 y.o. female for annual exam . #1 Health maintenance self breast exams on a monthly basis discussed and urged.  Had mammo this week will continue with annual mammography.  Ordered baseline CBC glucose lipid profile and urinalysis.  Using IUD doing well with this placed in July 2011 #2 History of elevated liver functions fatty liver we'll wait and recheck liver functions today as she cannot remember when she had them checked. If abnormal then we'll followup with her gastroenterologist. #3 Anxiety. Doing well with Zoloft and I refilled  her times a year. #4 Pelvic relaxation. Patient has a mild cystocele rectocele and uterine descent asymptomatic we'll continue to monitor. assuming she continues well then she will see [place] in a year sooner as needed.    Dara Lords MD, 11:52 AM 07/17/2011

## 2011-07-17 NOTE — Progress Notes (Signed)
Addended byCammie Mcgee T on: 07/17/2011 01:11 PM   Modules accepted: Orders

## 2011-07-20 ENCOUNTER — Telehealth: Payer: Self-pay | Admitting: *Deleted

## 2011-07-20 NOTE — Telephone Encounter (Signed)
LM ON PT VM WITH THE BELOW.

## 2011-07-20 NOTE — Telephone Encounter (Signed)
PT CALLING WANTING LAB RESULTS FROM OV:07/17/11.IN LIVER FUNCTION TEST THE ALT WAS ABNORMAL AT 47. WHAT SHOULD PT KNOW [id number] ABNORMAL NUMBER? PLEASE ADVISE.

## 2011-07-20 NOTE — Telephone Encounter (Signed)
Palpation I think her liver functions are essentially normal. One value is 47 upper limits of normal is 35 I think this is a marginal change.  In review of her chart her values before were 60 and 76 and now they are 36 and 47.

## 2011-12-28 ENCOUNTER — Ambulatory Visit (INDEPENDENT_AMBULATORY_CARE_PROVIDER_SITE_OTHER): Payer: Federal, State, Local not specified - PPO

## 2011-12-28 DIAGNOSIS — R5381 Other malaise: Secondary | ICD-10-CM

## 2011-12-28 DIAGNOSIS — J019 Acute sinusitis, unspecified: Secondary | ICD-10-CM

## 2012-05-12 ENCOUNTER — Ambulatory Visit (INDEPENDENT_AMBULATORY_CARE_PROVIDER_SITE_OTHER): Payer: Federal, State, Local not specified - PPO | Admitting: Family Medicine

## 2012-05-12 ENCOUNTER — Encounter: Payer: Self-pay | Admitting: Family Medicine

## 2012-05-12 VITALS — BP 131/89 | HR 73 | Temp 98.8°F | Resp 18 | Ht 65.0 in | Wt 265.8 lb

## 2012-05-12 DIAGNOSIS — R51 Headache: Secondary | ICD-10-CM

## 2012-05-12 DIAGNOSIS — J40 Bronchitis, not specified as acute or chronic: Secondary | ICD-10-CM

## 2012-05-12 MED ORDER — ALBUTEROL SULFATE HFA 108 (90 BASE) MCG/ACT IN AERS: 2.0000 | INHALATION_SPRAY | Freq: Four times a day (QID) | RESPIRATORY_TRACT | Status: DC | PRN

## 2012-05-12 MED ORDER — AZITHROMYCIN 250 MG PO TABS: ORAL_TABLET | ORAL | Status: AC

## 2012-05-12 MED ORDER — HYDROCODONE-HOMATROPINE 5-1.5 MG/5ML PO SYRP: 5.0000 mL | ORAL_SOLUTION | ORAL | Status: AC | PRN

## 2012-05-12 MED ORDER — BENZONATATE 100 MG PO CAPS: 100.0000 mg | ORAL_CAPSULE | Freq: Three times a day (TID) | ORAL | Status: AC | PRN

## 2012-05-12 MED ORDER — TRAMADOL HCL 50 MG PO TABS: 50.0000 mg | ORAL_TABLET | Freq: Four times a day (QID) | ORAL | Status: AC | PRN

## 2012-05-12 NOTE — Patient Instructions (Addendum)
Drink plenty of fluids. Get enough rest. Use the cough syrup at nighttime and cough pills in the day time. If he get worse your to return.

## 2012-05-12 NOTE — Progress Notes (Signed)
Subjective: 51 year old lady with a history of several weeks the respirator tract infection. She's had sinus had it checked now just has this persistent severe cough. Starting a fever. She is coughing up some phlegm. She does not have a history of recurrent  respiratory tract infections or annual spring-type infections. When she coughs hard she has a bad headache from right occipital area around to the right temple.  Objective: TMs are normal. Throat clear. Neck supple without significant nodes. Chest is clear to auscultation but she has a very harsh cough. Heart regular.  Assessment: Bronchitis, with component of asthmatic bronchitis  Plan: Zithromax Tessalon Albuterol inhaler Hycodan I do not feel like labs or x-rays are necessary today, but if she gets worse she is to let us

## 2012-05-14 ENCOUNTER — Encounter: Payer: Self-pay | Admitting: Family Medicine

## 2012-05-14 ENCOUNTER — Ambulatory Visit (INDEPENDENT_AMBULATORY_CARE_PROVIDER_SITE_OTHER): Payer: Federal, State, Local not specified - PPO | Admitting: Family Medicine

## 2012-05-14 ENCOUNTER — Ambulatory Visit: Payer: Federal, State, Local not specified - PPO

## 2012-05-14 VITALS — BP 140/90 | HR 79 | Temp 98.0°F | Resp 16 | Ht 66.5 in | Wt 269.0 lb

## 2012-05-14 DIAGNOSIS — R05 Cough: Secondary | ICD-10-CM

## 2012-05-14 DIAGNOSIS — J329 Chronic sinusitis, unspecified: Secondary | ICD-10-CM

## 2012-05-14 DIAGNOSIS — R059 Cough, unspecified: Secondary | ICD-10-CM

## 2012-05-14 LAB — POCT CBC
Granulocyte percent: 60 %G (ref 37–80)
HCT, POC: 42.1 % (ref 37.7–47.9)
Hemoglobin: 14.1 g/dL (ref 12.2–16.2)
Lymph, poc: 1.8 (ref 0.6–3.4)
MCH, POC: 27.8 pg (ref 27–31.2)
MCHC: 33.5 g/dL (ref 31.8–35.4)
MCV: 82.9 fL (ref 80–97)
MID (cbc): 0.4 (ref 0–0.9)
MPV: 11.2 fL (ref 0–99.8)
POC Granulocyte: 3.3 (ref 2–6.9)
POC LYMPH PERCENT: 32.2 %L (ref 10–50)
POC MID %: 7.8 %M (ref 0–12)
Platelet Count, POC: 206 10*3/uL (ref 142–424)
RBC: 5.08 M/uL (ref 4.04–5.48)
RDW, POC: 17.4 %
WBC: 5.5 10*3/uL (ref 4.6–10.2)

## 2012-05-14 MED ORDER — PREDNISONE 20 MG PO TABS: ORAL_TABLET | ORAL | Status: AC

## 2012-05-14 MED ORDER — FLUTICASONE PROPIONATE 50 MCG/ACT NA SUSP: 2.0000 | Freq: Every day | NASAL | Status: DC

## 2012-05-14 MED ORDER — LEVOFLOXACIN 500 MG PO TABS: 500.0000 mg | ORAL_TABLET | Freq: Every day | ORAL | Status: AC

## 2012-05-14 NOTE — Patient Instructions (Signed)
If not better by Friday begin the Levaquin after finishing the zithromax.

## 2012-05-14 NOTE — Progress Notes (Signed)
Subjective: Patient continues to just not feel good. It seems to move some up into her hand. She hurts in her face. Still has a headache. Not coughing up much phlegm. Throat has some discomfort but not bad ears feel like she's been in a tunnel. She does cough still. Her throat has some discomfort. Objective: TMs normal on the right. Left has a little pinhead size cystic white spot in the center of the drum, probably chronic. Her sinuses seem to be a little tender especially in the right maxillary area. Throat clear. Neck supple without significant nodes. Chest is clear. Heart regular without murmurs. [facility] reading (PRIMARY) by  Dr. Alwyn Ren Right maxillary haziness Results for orders placed in visit on 05/14/12  POCT CBC      Component Value Range   WBC 5.5  4.6 - 10.2 (K/uL)   Lymph, poc 1.8  0.6 - 3.4    POC LYMPH PERCENT 32.2  10 - 50 (%L)   MID (cbc) 0.4  0 - 0.9    POC MID % 7.8  0 - 12 (%M)   POC Granulocyte 3.3  2 - 6.9    Granulocyte percent 60.0  37 - 80 (%G)   RBC 5.08  4.04 - 5.48 (M/uL)   Hemoglobin 14.1  12.2 - 16.2 (g/dL)   HCT, POC 16.1  09.6 - 47.9 (%)   MCV 82.9  80 - 97 (fL)   MCH, POC 27.8  27 - 31.2 (pg)   MCHC 33.5  31.8 - 35.4 (g/dL)   RDW, POC 04.5     Platelet Count, POC 206  142 - 424 (K/uL)   MPV 11.2  0 - 99.8 (fL)   .    Assessment: Sinusitis Headache Cough Cyst left TM  Plan: Sinus x-ray and chest x-ray and CBC.

## 2012-07-17 DIAGNOSIS — K760 Fatty (change of) liver, not elsewhere classified: Secondary | ICD-10-CM

## 2012-07-17 HISTORY — DX: Fatty (change of) liver, not elsewhere classified: K76.0

## 2012-08-12 ENCOUNTER — Encounter: Payer: Self-pay | Admitting: Gynecology

## 2012-08-12 ENCOUNTER — Other Ambulatory Visit (HOSPITAL_COMMUNITY)
Admission: RE | Admit: 2012-08-12 | Discharge: 2012-08-12 | Disposition: A | Discharge status: Home / Self Care | Payer: Federal, State, Local not specified - PPO | Source: Ambulatory Visit | Attending: Gynecology | Admitting: Gynecology

## 2012-08-12 ENCOUNTER — Ambulatory Visit (INDEPENDENT_AMBULATORY_CARE_PROVIDER_SITE_OTHER): Payer: Federal, State, Local not specified - PPO | Admitting: Gynecology

## 2012-08-12 VITALS — BP 120/86 | Ht 64.5 in | Wt 265.0 lb

## 2012-08-12 DIAGNOSIS — K7689 Other specified diseases of liver: Secondary | ICD-10-CM

## 2012-08-12 DIAGNOSIS — K760 Fatty (change of) liver, not elsewhere classified: Secondary | ICD-10-CM

## 2012-08-12 DIAGNOSIS — Z1151 Encounter for screening for human papillomavirus (HPV): Secondary | ICD-10-CM | POA: Insufficient documentation

## 2012-08-12 DIAGNOSIS — F411 Generalized anxiety disorder: Secondary | ICD-10-CM

## 2012-08-12 DIAGNOSIS — F419 Anxiety disorder, unspecified: Secondary | ICD-10-CM

## 2012-08-12 DIAGNOSIS — Z131 Encounter for screening for diabetes mellitus: Secondary | ICD-10-CM

## 2012-08-12 DIAGNOSIS — Z13220 Encounter for screening for lipoid disorders: Secondary | ICD-10-CM

## 2012-08-12 DIAGNOSIS — Z01419 Encounter for gynecological examination (general) (routine) without abnormal findings: Secondary | ICD-10-CM | POA: Insufficient documentation

## 2012-08-12 MED ORDER — SERTRALINE HCL 50 MG PO TABS: 50.0000 mg | ORAL_TABLET | Freq: Every day | ORAL | Status: DC

## 2012-08-12 NOTE — Addendum Note (Signed)
Addended by: Bertram Savin A on: 08/12/2012 03:48 PM   Modules accepted: Orders

## 2012-08-12 NOTE — Progress Notes (Signed)
Grace Wells 1961/10/30 [id number]        51 y.o.  G2P2 for annual exam.  Several issues noted below.  Past medical history,surgical history, medications, allergies, family history and social history were all reviewed and documented in the EPIC chart. ROS:  Was performed and pertinent positives and negatives are included in the history.  Exam: Biomedical scientist Filed Vitals:   08/12/12 1422  BP: 120/86  Height: 5' 4.5" (1.638 m)  Weight: 265 lb (120.203 kg)   General appearance  Normal Skin grossly normal Head/Neck normal with no cervical or supraclavicular adenopathy thyroid normal Lungs  clear Cardiac RR, without RMG Abdominal  soft, nontender, without masses, organomegaly or hernia Breasts  examined lying and sitting without masses, retractions, discharge or axillary adenopathy. Pelvic  Ext/BUS/vagina  normal   Cervix  normal Pap/HPV, IUD string visualized   Uterus  anteverted, normal size, shape and contour, midline and mobile nontender   Adnexa  Without masses or tenderness    Anus and perineum  normal   Rectovaginal  normal sphincter tone without palpated masses or tenderness.    Assessment/Plan:  51 y.o. G2P2 female for annual exam.   1. Mirena IUD. Placed in 06/2010. Absent menses doing well we'll continue to monitor. 2. Anxiety. Patient is on Zoloft 50 mg doing well. Husband with metastatic prostate cancer. Refilled Zoloft x1 year. 3. History low-grade dysplasia 2008 through 2010, Paps 2011 and 2012 normal. Pap/HPV done today. If negative plan 5 year follow up. 4. Mammography. Patient has scheduled today. Patient will continue with annual mammography. SBE monthly reviewed. 5. Health maintenance. Baseline CBC lipid profile urinalysis and comprehensive metabolic panel ordered. She does have a history of fatty liver with mildly elevated LFTs we'll recheck these today. Follow up one year, sooner as needed    Dara Lords MD, 2:48 PM 08/12/2012

## 2012-08-12 NOTE — Patient Instructions (Signed)
Follow up for annual exam in one year 

## 2012-08-13 ENCOUNTER — Encounter: Payer: Self-pay | Admitting: Gynecology

## 2012-08-13 LAB — URINALYSIS W MICROSCOPIC + REFLEX CULTURE
Bacteria, UA: NONE SEEN
Bilirubin Urine: NEGATIVE
Casts: NONE SEEN
Crystals: NONE SEEN
Glucose, UA: NEGATIVE mg/dL
Hgb urine dipstick: NEGATIVE
Ketones, ur: NEGATIVE mg/dL
Nitrite: NEGATIVE
Protein, ur: NEGATIVE mg/dL
Specific Gravity, Urine: 1.006 (ref 1.005–1.030)
Squamous Epithelial / LPF: NONE SEEN
Urobilinogen, UA: 0.2 mg/dL (ref 0.0–1.0)
pH: 7 (ref 5.0–8.0)

## 2012-08-13 LAB — CBC WITH DIFFERENTIAL/PLATELET
Basophils Absolute: 0 10*3/uL (ref 0.0–0.1)
Basophils Relative: 1 % (ref 0–1)
Eosinophils Absolute: 0.1 10*3/uL (ref 0.0–0.7)
Eosinophils Relative: 2 % (ref 0–5)
HCT: 40.6 % (ref 36.0–46.0)
Hemoglobin: 13.9 g/dL (ref 12.0–15.0)
Lymphocytes Relative: 28 % (ref 12–46)
Lymphs Abs: 1.7 10*3/uL (ref 0.7–4.0)
MCH: 28 pg (ref 26.0–34.0)
MCHC: 34.2 g/dL (ref 30.0–36.0)
MCV: 81.9 fL (ref 78.0–100.0)
Monocytes Absolute: 0.5 10*3/uL (ref 0.1–1.0)
Monocytes Relative: 8 % (ref 3–12)
Neutro Abs: 3.8 10*3/uL (ref 1.7–7.7)
Neutrophils Relative %: 61 % (ref 43–77)
Platelets: 217 10*3/uL (ref 150–400)
RBC: 4.96 MIL/uL (ref 3.87–5.11)
RDW: 15.6 % — ABNORMAL HIGH (ref 11.5–15.5)
WBC: 6.1 10*3/uL (ref 4.0–10.5)

## 2012-08-13 LAB — COMPREHENSIVE METABOLIC PANEL
ALT: 44 U/L — ABNORMAL HIGH (ref 0–35)
AST: 41 U/L — ABNORMAL HIGH (ref 0–37)
Albumin: 4.3 g/dL (ref 3.5–5.2)
Alkaline Phosphatase: 90 U/L (ref 39–117)
BUN: 9 mg/dL (ref 6–23)
CO2: 26 mEq/L (ref 19–32)
Calcium: 9.6 mg/dL (ref 8.4–10.5)
Chloride: 107 mEq/L (ref 96–112)
Creat: 0.68 mg/dL (ref 0.50–1.10)
Glucose, Bld: 78 mg/dL (ref 70–99)
Potassium: 4 mEq/L (ref 3.5–5.3)
Sodium: 141 mEq/L (ref 135–145)
Total Bilirubin: 1 mg/dL (ref 0.3–1.2)
Total Protein: 6.9 g/dL (ref 6.0–8.3)

## 2012-08-13 LAB — LIPID PANEL
Cholesterol: 129 mg/dL (ref 0–200)
HDL: 36 mg/dL — ABNORMAL LOW (ref >39)
LDL Cholesterol: 73 mg/dL (ref 0–99)
Total CHOL/HDL Ratio: 3.6 Ratio
Triglycerides: 101 mg/dL (ref <150)
VLDL: 20 mg/dL (ref 0–40)

## 2012-08-14 LAB — URINE CULTURE
Colony Count: NO GROWTH
Organism ID, Bacteria: NO GROWTH

## 2012-12-17 DIAGNOSIS — N816 Rectocele: Secondary | ICD-10-CM

## 2012-12-17 HISTORY — DX: Rectocele: N81.6

## 2013-02-10 ENCOUNTER — Other Ambulatory Visit: Payer: Self-pay | Admitting: *Deleted

## 2013-02-10 DIAGNOSIS — F419 Anxiety disorder, unspecified: Secondary | ICD-10-CM

## 2013-02-10 MED ORDER — SERTRALINE HCL 50 MG PO TABS: 50.0000 mg | ORAL_TABLET | Freq: Every day | ORAL | Status: DC

## 2013-03-03 ENCOUNTER — Ambulatory Visit (INDEPENDENT_AMBULATORY_CARE_PROVIDER_SITE_OTHER): Payer: Federal, State, Local not specified - PPO | Admitting: Family Medicine

## 2013-03-03 VITALS — BP 118/78 | HR 95 | Temp 98.6°F | Resp 17 | Ht 66.5 in | Wt 258.0 lb

## 2013-03-03 DIAGNOSIS — J019 Acute sinusitis, unspecified: Secondary | ICD-10-CM

## 2013-03-03 DIAGNOSIS — J029 Acute pharyngitis, unspecified: Secondary | ICD-10-CM

## 2013-03-03 LAB — POCT CBC
Granulocyte percent: 72.9 %G (ref 37–80)
HCT, POC: 40.7 % (ref 37.7–47.9)
Hemoglobin: 12.9 g/dL (ref 12.2–16.2)
Lymph, poc: 1.6 (ref 0.6–3.4)
MCH, POC: 27.4 pg (ref 27–31.2)
MCHC: 31.7 g/dL — AB (ref 31.8–35.4)
MCV: 86.4 fL (ref 80–97)
MID (cbc): 0.5 (ref 0–0.9)
MPV: 11.2 fL (ref 0–99.8)
POC Granulocyte: 5.8 (ref 2–6.9)
POC LYMPH PERCENT: 20.4 %L (ref 10–50)
POC MID %: 6.7 %M (ref 0–12)
Platelet Count, POC: 195 10*3/uL (ref 142–424)
RBC: 4.71 M/uL (ref 4.04–5.48)
RDW, POC: 16 %
WBC: 7.9 10*3/uL (ref 4.6–10.2)

## 2013-03-03 LAB — POCT RAPID STREP A (OFFICE): Rapid Strep A Screen: NEGATIVE

## 2013-03-03 MED ORDER — AZITHROMYCIN 250 MG PO TABS: ORAL_TABLET | ORAL | Status: DC

## 2013-03-03 NOTE — Patient Instructions (Signed)

## 2013-03-03 NOTE — Progress Notes (Signed)
Subjective: For over a week this lady has had a sore throat. She hurts in the nodes of her neck. She has had a little bit of drainage that had some blood in it this morning. She had low-grade fever at home.  She is grieving the recent loss of her husband and loss of her job.  Objective: TMs are normal. Throat mildly erythematous. A tiny bit of mucus in 2 of the crypts, but this does not appear to be a purulent sore throat. Neck was supple with mild tenderness but no major nodes. Chest is clear to auscultation. Heart regular without murmurs.  Assessment Pharyngitis Probable posterior sinusitis  Plan: Zithromax Throat cultures pending     Results for orders placed in visit on 03/03/13  POCT RAPID STREP A (OFFICE)      Result Value Range   Rapid Strep A Screen Negative  Negative  POCT CBC      Result Value Range   WBC 7.9  4.6 - 10.2 K/uL   Lymph, poc 1.6  0.6 - 3.4   POC LYMPH PERCENT 20.4  10 - 50 %L   MID (cbc) 0.5  0 - 0.9   POC MID % 6.7  0 - 12 %M   POC Granulocyte 5.8  2 - 6.9   Granulocyte percent 72.9  37 - 80 %G   RBC 4.71  4.04 - 5.48 M/uL   Hemoglobin 12.9  12.2 - 16.2 g/dL   HCT, POC 45.4  09.8 - 47.9 %   MCV 86.4  80 - 97 fL   MCH, POC 27.4  27 - 31.2 pg   MCHC 31.7 (*) 31.8 - 35.4 g/dL   RDW, POC 11.9     Platelet Count, POC 195  142 - 424 K/uL   MPV 11.2  0 - 99.8 fL

## 2013-03-05 LAB — CULTURE, GROUP A STREP

## 2013-03-17 ENCOUNTER — Ambulatory Visit (INDEPENDENT_AMBULATORY_CARE_PROVIDER_SITE_OTHER): Payer: Federal, State, Local not specified - PPO | Admitting: Physician Assistant

## 2013-03-17 VITALS — BP 116/80 | HR 76 | Temp 98.5°F | Resp 18 | Ht 65.5 in | Wt 254.0 lb

## 2013-03-17 DIAGNOSIS — R0609 Other forms of dyspnea: Secondary | ICD-10-CM

## 2013-03-17 DIAGNOSIS — R0683 Snoring: Secondary | ICD-10-CM

## 2013-03-17 DIAGNOSIS — J309 Allergic rhinitis, unspecified: Secondary | ICD-10-CM

## 2013-03-17 DIAGNOSIS — R0989 Other specified symptoms and signs involving the circulatory and respiratory systems: Secondary | ICD-10-CM

## 2013-03-17 DIAGNOSIS — J329 Chronic sinusitis, unspecified: Secondary | ICD-10-CM

## 2013-03-17 MED ORDER — LEVOFLOXACIN 500 MG PO TABS: 500.0000 mg | ORAL_TABLET | Freq: Every day | ORAL | Status: DC

## 2013-03-17 MED ORDER — FLUTICASONE PROPIONATE 50 MCG/ACT NA SUSP: 2.0000 | Freq: Every day | NASAL | Status: DC

## 2013-03-17 NOTE — Progress Notes (Signed)
Subjective:    Patient ID: Grace Wells, female    DOB: [birth year], 52 y.o.   MRN: [id number]  HPI   Grace Wells is a pleasant 52 yr old female here with concern for sinusitis.  States she was here about 3 weeks ago for strep throat.  She was having some sinus pressure at that time as well.  Was treated with a Zpak.  Her throat feels better but sinus congestion never cleared.  She is having some facial pain and upper tooth pain.  Has been using Benadryl think this may be allergy related, does not seem to help.  Took Dayquil yesterday, also with little relief.  Also endorses itchy watery eyes and scratchy throat.  She did have a fever to 101.36F two nights ago.    She uses Flonase intermittently  Additionally she has noted a white spot on her throat that has been present since she had strep several weeks.  The throat is no longer painful.    Pt also has concern for sleep apnea.  States her son recorded her sleeping - loud snoring several pauses.  Would like referral for sleep study.  Review of Systems  Constitutional: Positive for fever. Negative for chills.  HENT: Positive for sore throat and sinus pressure.   Respiratory: Positive for cough. Negative for shortness of breath and wheezing.   Cardiovascular: Negative.   Gastrointestinal: Negative.   Musculoskeletal: Negative.   Skin: Negative.   Neurological: Negative.        Objective:   Physical Exam  Vitals reviewed. Constitutional: She is oriented to person, place, and time. She appears well-developed and well-nourished. No distress.  HENT:  Head: Normocephalic and atraumatic.  Right Ear: Tympanic membrane and ear canal normal.  Left Ear: Tympanic membrane and ear canal normal.  Nose: Mucosal edema present. Right sinus exhibits maxillary sinus tenderness. Right sinus exhibits no frontal sinus tenderness. Left sinus exhibits maxillary sinus tenderness. Left sinus exhibits no frontal sinus tenderness.  Mouth/Throat: Uvula is  midline, oropharynx is clear and moist and mucous membranes are normal.    Eyes: Conjunctivae are normal. No scleral icterus.  Neck: Neck supple.  Cardiovascular: Normal rate, regular rhythm and normal heart sounds.   Pulmonary/Chest: Effort normal and breath sounds normal. She has no wheezes. She has no rales.  Lymphadenopathy:    She has no cervical adenopathy.  Neurological: She is alert and oriented to person, place, and time.  Skin: Skin is warm and dry.  Psychiatric: She has a normal mood and affect. Her behavior is normal.     Filed Vitals:   03/17/13 0925  BP: 116/80  Pulse: 76  Temp: 98.5 F (36.9 C)  Resp: 18        Assessment & Plan:  Sinusitis - Plan: levofloxacin (LEVAQUIN) 500 MG tablet  -- Pt with maxillary sinus tenderness for the last 3 weeks.  Also with fever over the weekend.  Due to PCN and recent azithromycin use, will treat with levaquin x 7 days.  Encouraged plenty of fluids and rest.  Suspect that allergic rhinitis is contributing to her symptoms.    Allergic rhinitis - Plan: fluticasone (FLONASE) 50 MCG/ACT nasal spray  -- Discussed with pt that Flonase cannot be used intermittently.  Encouraged her to use daily, at least for the next couple months as allergens are more prevalent this time of year.  I have refilled this for her.  Also encouraged the use of otc antihistamine daily for symptom control.  Snoring -  Plan: Ambulatory referral to Sleep Studies  -- Pt concerned about sleep apnea, would like sleep study.  Referral placed.    If symptoms worsening or not improving, pt will RTC.  I think it's reasonable to watch and wait on the white spot anterior to the left tonsil.  It is not painful  This maybe residual from strep throat/URI.  If it persists, may consider ENT referral for further eval.

## 2013-03-17 NOTE — Patient Instructions (Addendum)
Begin taking the antibiotic as directed.  Be sure to finish the full course.  Plenty of fluids (water is best!)   Begin using the Flonase daily - continue this through the next several months, this will help with allergy symptoms which may be at the root of your sinus problem.  Additionally begin a daily oral allergy medication (Claritin/Zyrtec/Allegra) to help control allergy symptoms.  If worsening or not improving, please let us know.   Sinusitis Sinusitis is redness, soreness, and swelling (inflammation) of the paranasal sinuses. Paranasal sinuses are air pockets within the bones of your face (beneath the eyes, the middle of the forehead, or above the eyes). In healthy paranasal sinuses, mucus is able to drain out, and air is able to circulate through them by way of your nose. However, when your paranasal sinuses are inflamed, mucus and air can become trapped. This can allow bacteria and other germs to grow and cause infection. Sinusitis can develop quickly and last only a short time (acute) or continue over a long period (chronic). Sinusitis that lasts for more than 12 weeks is considered chronic.  CAUSES  Causes of sinusitis include:  Allergies.  Structural abnormalities, such as displacement of the cartilage that separates your nostrils (deviated septum), which can decrease the air flow through your nose and sinuses and affect sinus drainage.  Functional abnormalities, such as when the small hairs (cilia) that line your sinuses and help remove mucus do not work properly or are not present. SYMPTOMS  Symptoms of acute and chronic sinusitis are the same. The primary symptoms are pain and pressure around the affected sinuses. Other symptoms include:  Upper toothache.  Earache.  Headache.  Bad breath.  Decreased sense of smell and taste.  A cough, which worsens when you are lying flat.  Fatigue.  Fever.  Thick drainage from your nose, which often is green and may contain pus  (purulent).  Swelling and warmth over the affected sinuses. DIAGNOSIS  Your caregiver will perform a physical exam. During the exam, your caregiver may:  Look in your nose for signs of abnormal growths in your nostrils (nasal polyps).  Tap over the affected sinus to check for signs of infection.  View the inside of your sinuses (endoscopy) with a special imaging device with a light attached (endoscope), which is inserted into your sinuses. If your caregiver suspects that you have chronic sinusitis, one or more of the following tests may be recommended:  Allergy tests.  Nasal culture A sample of mucus is taken from your nose and sent to a lab and screened for bacteria.  Nasal cytology A sample of mucus is taken from your nose and examined by your caregiver to determine if your sinusitis is related to an allergy. TREATMENT  Most cases of acute sinusitis are related to a viral infection and will resolve on their own within 10 days. Sometimes medicines are prescribed to help relieve symptoms (pain medicine, decongestants, nasal steroid sprays, or saline sprays).  However, for sinusitis related to a bacterial infection, your caregiver will prescribe antibiotic medicines. These are medicines that will help kill the bacteria causing the infection.  Rarely, sinusitis is caused by a fungal infection. In theses cases, your caregiver will prescribe antifungal medicine. For some cases of chronic sinusitis, surgery is needed. Generally, these are cases in which sinusitis recurs more than 3 times per year, despite other treatments. HOME CARE INSTRUCTIONS   Drink plenty of water. Water helps thin the mucus so your sinuses can drain  more easily.  Use a humidifier.  Inhale steam 3 to 4 times a day (for example, sit in the bathroom with the shower running).  Apply a warm, moist washcloth to your face 3 to 4 times a day, or as directed by your caregiver.  Use saline nasal sprays to help moisten and  clean your sinuses.  Take over-the-counter or prescription medicines for pain, discomfort, or fever only as directed by your caregiver. SEEK IMMEDIATE MEDICAL CARE IF:  You have increasing pain or severe headaches.  You have nausea, vomiting, or drowsiness.  You have swelling around your face.  You have vision problems.  You have a stiff neck.  You have difficulty breathing. MAKE SURE YOU:   Understand these instructions.  Will watch your condition.  Will get help right away if you are not doing well or get worse. Document Released: 12/03/2005 Document Revised: 02/25/2012 Document Reviewed: 12/18/2011 [facility] Patient Information 2013 [place], [place].

## 2013-04-03 ENCOUNTER — Other Ambulatory Visit: Payer: Self-pay | Admitting: Neurology

## 2013-04-03 ENCOUNTER — Telehealth: Payer: Self-pay | Admitting: Neurology

## 2013-04-03 DIAGNOSIS — G4733 Obstructive sleep apnea (adult) (pediatric): Secondary | ICD-10-CM

## 2013-04-03 NOTE — Telephone Encounter (Signed)
Grace Stain PA-C is referring patient for sleep studies.  Wt. 254 lbs   Ht. 5'5.5" BMI 41.61  Loud Snoring with pauses Obesity  Medications Flonase 50 MCG/ACT Levaquin 500 MG Mirena 20 MCG/24HR Zoloft 50 MG  Grace Debbra Riding PA-C is referring patient for sleep studies.  Patient has concern for sleep apnea.  Her son recorded her sleeping - loud snoring several pauses.  She endorses [place] at 13.    Insurance: [employer] - prior authorization is not required

## 2013-04-07 ENCOUNTER — Other Ambulatory Visit: Payer: Self-pay | Admitting: Neurology

## 2013-04-07 DIAGNOSIS — G4733 Obstructive sleep apnea (adult) (pediatric): Secondary | ICD-10-CM

## 2013-04-17 ENCOUNTER — Ambulatory Visit (INDEPENDENT_AMBULATORY_CARE_PROVIDER_SITE_OTHER): Payer: Federal, State, Local not specified - PPO | Admitting: Neurology

## 2013-04-17 VITALS — BP 135/75 | HR 68 | Ht 65.0 in | Wt 250.0 lb

## 2013-04-17 DIAGNOSIS — G4733 Obstructive sleep apnea (adult) (pediatric): Secondary | ICD-10-CM

## 2013-04-24 ENCOUNTER — Telehealth: Payer: Self-pay | Admitting: Neurology

## 2013-04-24 NOTE — Telephone Encounter (Signed)
Please call and notify the patient that the recent sleep study did confirm the diagnosis of obstructive sleep apnea and that I would like to discuss the sleep study results and treatment during an appt. Please explain to patient and arrange for a sleep consultation at the next available appt with me.  Huston Foley, MD, PhD [facility])

## 2013-07-16 ENCOUNTER — Encounter: Payer: Self-pay | Admitting: Neurology

## 2013-07-16 ENCOUNTER — Ambulatory Visit (INDEPENDENT_AMBULATORY_CARE_PROVIDER_SITE_OTHER): Payer: Federal, State, Local not specified - PPO | Admitting: Neurology

## 2013-07-16 VITALS — BP 124/85 | HR 72 | Ht 65.0 in | Wt 260.0 lb

## 2013-07-16 DIAGNOSIS — G4733 Obstructive sleep apnea (adult) (pediatric): Secondary | ICD-10-CM

## 2013-07-16 NOTE — Progress Notes (Signed)
Subjective:    Patient ID: Grace Wells is a 52 y.o. female.  HPI  Huston Foley, MD, PhD [facility] [address], [address] [address] [place], [place] [address]   Dear Veronia Beets,   I saw your patient, Grace Wells, upon your kind request in my neurologic clinic today for initial consultation of her obstructive sleep apnea after a recent sleep test. The patient is unaccompanied today. As you know, Grace Wells is a very pleasant 52 year old right-handed woman with an underlying medical history of depression, sinusitis, and allergic rhinitis who has been known to snore for several months to years. First on who is 52 years old has recently recorded her snoring which also recorded breathing pauses while she is asleep. She has a history of obesity but has recently been able to lose some weight. She has been struggling with depression since her husband passed away in 11/18/23 last year. She currently lives with her son. Her bedtime is quite erratic. She usually falls asleep on the couch and then goes to bed that when she wakes up in the middle of the night. She usually wakes up once in the middle of the night. Usually falls asleep while watching TV in the evening. Her wake time is usually 5:30 AM. She does not feel well rested first thing in the morning. She denies restless leg symptoms. She is not known to take in her sleep. She denies cataplexy, sleep paralysis, recurrent morning headaches, but endorses daytime tiredness and sleepiness. Her Epworth sleepiness score is 13 out of a maximum of 24. She has not fallen asleep at the wheel. She had a split-night sleep study on 04/17/2013 and I went over her test results with her in detail today. Her baseline sleep efficiency was normal at 93.2% with a latency to sleep of 5.5 minutes. She had a reduced percentage of REM sleep at 6.5% prior to CPAP initiation. She had no significant PLMS. She had mild to moderate and at times loud  snoring. She had mostly obstructive hypopneas. Her total AHI was 25 per hour. Her baseline oxygen saturation was 92%, her nadir was 72% in rem sleep. She was then titrated on CPAP between 5-8 cm of water pressure and did well with 8 cm of water. She had REM rebound at 57.3%. Her AHI was reduced to 2.1 events per hour. She consumes 1 to 3 caffeinated beverages per day, usually in the form of sodas.  Her brother has OSA on CPAP.  She is a little tearful today endorsing some psychosocial stressors and she still dealing with her husband's death.  Her Past Medical History Is Significant For: Past Medical History  Diagnosis Date  . Fatty liver 07/2012    mild elevated LFTs  . GERD (gastroesophageal reflux disease)   . Obesity   . Diverticulitis   . Anxiety   . Cervical dysplasia     LGSIL C&B 6/08, LGSIL pap12/09 & 6/10, WNL 7/11    Her Past Surgical History Is Significant For: Past Surgical History  Procedure Laterality Date  . Intrauterine device insertion  06/2010    Mirena    Her Family History Is Significant For: Family History  Problem Relation Age of Onset  . Diabetes Maternal Grandmother   . Cancer Paternal Grandmother     colon  . Heart disease Paternal Grandfather     stroke  . Stroke Paternal Grandfather   . Cancer Mother   . Cancer Father   . Cancer Sister   .  Hypertension Brother     Her Social History Is Significant For: History   Social History  . Marital Status: Married    Spouse Name: N/A    Number of Children: N/A  . Years of Education: N/A   Social History Main Topics  . Smoking status: Never Smoker   . Smokeless tobacco: Never Used  . Alcohol Use: No  . Drug Use: No  . Sexually Active: No     Comment: Mirena 06/2010    Other Topics Concern  . None   Social History Narrative  . None    Her Allergies Are:  Allergies  Allergen Reactions  . Codeine   . Penicillins     Fingers turned blue, red splotches, started swelling  :   Her  Current Medications Are:  Outpatient Encounter Prescriptions as of 07/16/2013  Medication Sig Dispense Refill  . levonorgestrel (MIRENA) 20 MCG/24HR IUD 1 each by Intrauterine route once. Inserted 07/12/2010 needs removal 06/2015      . sertraline (ZOLOFT) 50 MG tablet Take 1 tablet (50 mg total) by mouth daily.  90 tablet  2  . [DISCONTINUED] fluticasone (FLONASE) 50 MCG/ACT nasal spray Place 2 sprays into the nose daily.  16 g  3  . [DISCONTINUED] levofloxacin (LEVAQUIN) 500 MG tablet Take 1 tablet (500 mg total) by mouth daily.  7 tablet  0   No facility-administered encounter medications on file as of 07/16/2013.  :  Review of Systems  Respiratory:       Snoring  Psychiatric/Behavioral: Positive for sleep disturbance.    Objective:  Neurologic Exam  Physical Exam Physical Examination:   Filed Vitals:   07/16/13 1608  BP: 124/85  Pulse: 72    General Examination: The patient is a very pleasant 52 y.o. female in no acute distress. She appears well-developed and well-nourished and well groomed. She is obese.  HEENT: Normocephalic, atraumatic, pupils are equal, round and reactive to light and accommodation. Extraocular tracking is good without limitation to gaze excursion or nystagmus noted. Normal smooth pursuit is noted. Hearing is grossly intact. Tympanic membranes are clear bilaterally. Face is symmetric with normal facial animation and normal facial sensation. Speech is clear with no dysarthria noted. There is no hypophonia. There is no lip, neck/head, jaw or voice tremor. Neck is supple with full range of passive and active motion. There are no carotid bruits on auscultation. Oropharynx exam reveals: mild mouth dryness, adequate dental hygiene and mild airway crowding, due to narrow airway and tonsils of 1-2+. Mallampati is class II. Tongue protrudes centrally and palate elevates symmetrically.   Chest: Clear to auscultation without wheezing, rhonchi or crackles noted.  Heart:  S1+S2+0, regular and normal without murmurs, rubs or gallops noted.   Abdomen: Soft, non-tender and non-distended with normal bowel sounds appreciated on auscultation.  Extremities: There is trace pitting edema in the distal lower extremities bilaterally in the ankles. Pedal pulses are intact.  Skin: Warm and dry without trophic changes noted. There are no varicose veins.  Musculoskeletal: exam reveals no obvious joint deformities, tenderness or joint swelling or erythema.   Neurologically:  Mental status: The patient is awake, alert and oriented in all 4 spheres. Her memory, attention, language and knowledge are appropriate. There is no aphasia, agnosia, apraxia or anomia. Speech is clear with normal prosody and enunciation. Thought process is linear. Mood is congruent and affect is blunted.  Cranial nerves are as described above under HEENT exam. In addition, shoulder shrug is normal with equal  shoulder height noted. Motor exam: Normal bulk, strength and tone is noted. There is no drift, tremor or rebound. Reflexes are 2+ throughout. Toes are downgoing bilaterally. Fine motor skills are intact with normal finger taps, normal hand movements, normal rapid alternating patting, normal foot taps and normal foot agility.  Cerebellar testing shows no dysmetria or intention tremor on finger to nose testing. There is no truncal or gait ataxia.  Sensory exam is intact to light touch.  Gait, station and balance are unremarkable. No veering to one side is noted. No leaning to one side is noted. Posture is age-appropriate and stance is narrow based. No problems turning are noted. She turns en bloc.         Assessment and Plan:   In summary, Grace Wells is a very pleasant 52 y.o.-year old female with a recently confirmed Dx of obstructive sleep apnea (OSA). She had a sleep study, which I discussed with her and she did well with CPAP during the study.  I had a long chat with the patient about my  findings and the diagnosis of OSA, its prognosis and treatment options. We talked about medical treatments and non-pharmacological approaches. I explained in particular the risks and ramifications of untreated moderate to severe OSA, especially with respect to developing cardiovascular disease down the Road, including congestive heart failure, difficult to treat hypertension, cardiac arrhythmias, or stroke. Even type 2 diabetes has in part been linked to untreated OSA. We talked about trying to maintain a healthy lifestyle in general, as well as the importance of weight control. I encouraged the patient to eat healthy, exercise daily and keep well hydrated, to keep a scheduled bedtime and wake time routine, to not skip any meals and eat healthy snacks in between meals.  I recommended the following at this time: initiate CPAP treatment. I explained the CPAP treatment and also outlined surgical and non-surgical treatment options of OSA including the use of a dental custom-made appliance, upper airway surgery such as pillar implants, radiofrequency surgery, tongue base surgery, and UPPP. She indicated that she is willing to start CPAP. I explained the importance of being compliant with PAP treatment, not only for insurance purposes but primarily for the patient's long term health benefit. I answered all her questions today and the patient was in agreement. I would like to see her back in 3 months, sooner if the need arises and encouraged her to call with any interim questions, concerns, problems or updates.   Thank you very much for allowing me to participate in the care of this nice patient. If I can be of any further assistance to you please do not hesitate to call me at [phone].  Sincerely,   Huston Foley, MD, PhD

## 2013-07-16 NOTE — Patient Instructions (Addendum)
Please start using your CPAP regularly. While your insurance requires that you use CPAP at least 4 hours each night on 70% of the nights, I recommend, that you not skip any nights and use it throughout the night if you can. Getting used to CPAP does take time and patience and discipline. Untreated obstructive sleep apnea when it is moderate to severe can have an adverse impact on cardiovascular health and raise her risk for heart disease, arrhythmias, hypertension, congestive heart failure, stroke and diabetes. Untreated obstructive sleep apnea causes sleep disruption, nonrestorative sleep, and sleep deprivation. This can have an impact on your day to day functioning and cause daytime sleepiness and impairment of cognitive function, memory loss, mood disturbance, and problems focussing. Using CPAP regularly can improve these symptoms.

## 2013-10-16 ENCOUNTER — Ambulatory Visit: Payer: Federal, State, Local not specified - PPO | Admitting: Neurology

## 2013-10-20 ENCOUNTER — Other Ambulatory Visit: Payer: Self-pay | Admitting: Gynecology

## 2013-10-20 NOTE — Telephone Encounter (Signed)
Last annual exam was August 2013 so she is overdue. She has CE scheduled for 11/24/13 with you.

## 2013-10-30 ENCOUNTER — Encounter: Payer: Self-pay | Admitting: Gynecology

## 2013-10-30 ENCOUNTER — Ambulatory Visit (INDEPENDENT_AMBULATORY_CARE_PROVIDER_SITE_OTHER): Payer: Federal, State, Local not specified - PPO | Admitting: Gynecology

## 2013-10-30 VITALS — BP 136/86 | Ht 65.0 in | Wt 253.0 lb

## 2013-10-30 DIAGNOSIS — Z01419 Encounter for gynecological examination (general) (routine) without abnormal findings: Secondary | ICD-10-CM

## 2013-10-30 DIAGNOSIS — N816 Rectocele: Secondary | ICD-10-CM | POA: Insufficient documentation

## 2013-10-30 DIAGNOSIS — K760 Fatty (change of) liver, not elsewhere classified: Secondary | ICD-10-CM

## 2013-10-30 DIAGNOSIS — K7689 Other specified diseases of liver: Secondary | ICD-10-CM

## 2013-10-30 LAB — CBC WITH DIFFERENTIAL/PLATELET
Basophils Absolute: 0 10*3/uL (ref 0.0–0.1)
Basophils Relative: 1 % (ref 0–1)
Eosinophils Absolute: 0 10*3/uL (ref 0.0–0.7)
Eosinophils Relative: 1 % (ref 0–5)
HCT: 40.3 % (ref 36.0–46.0)
Hemoglobin: 13.9 g/dL (ref 12.0–15.0)
Lymphocytes Relative: 22 % (ref 12–46)
Lymphs Abs: 1.2 10*3/uL (ref 0.7–4.0)
MCH: 28.1 pg (ref 26.0–34.0)
MCHC: 34.5 g/dL (ref 30.0–36.0)
MCV: 81.6 fL (ref 78.0–100.0)
Monocytes Absolute: 0.4 10*3/uL (ref 0.1–1.0)
Monocytes Relative: 7 % (ref 3–12)
Neutro Abs: 3.7 10*3/uL (ref 1.7–7.7)
Neutrophils Relative %: 69 % (ref 43–77)
Platelets: 189 10*3/uL (ref 150–400)
RBC: 4.94 MIL/uL (ref 3.87–5.11)
RDW: 14.5 % (ref 11.5–15.5)
WBC: 5.3 10*3/uL (ref 4.0–10.5)

## 2013-10-30 LAB — LIPID PANEL
Cholesterol: 125 mg/dL (ref 0–200)
HDL: 43 mg/dL (ref >39)
LDL Cholesterol: 72 mg/dL (ref 0–99)
Total CHOL/HDL Ratio: 2.9 Ratio
Triglycerides: 51 mg/dL (ref <150)
VLDL: 10 mg/dL (ref 0–40)

## 2013-10-30 LAB — URINALYSIS W MICROSCOPIC + REFLEX CULTURE
Bilirubin Urine: NEGATIVE
Casts: NONE SEEN
Crystals: NONE SEEN
Glucose, UA: NEGATIVE mg/dL
Ketones, ur: NEGATIVE mg/dL
Nitrite: NEGATIVE
Protein, ur: NEGATIVE mg/dL
Specific Gravity, Urine: 1.01 (ref 1.005–1.030)
Urobilinogen, UA: 0.2 mg/dL (ref 0.0–1.0)
pH: 6 (ref 5.0–8.0)

## 2013-10-30 LAB — COMPREHENSIVE METABOLIC PANEL
ALT: 22 U/L (ref 0–35)
AST: 20 U/L (ref 0–37)
Albumin: 4.1 g/dL (ref 3.5–5.2)
Alkaline Phosphatase: 77 U/L (ref 39–117)
BUN: 10 mg/dL (ref 6–23)
CO2: 25 mEq/L (ref 19–32)
Calcium: 9.2 mg/dL (ref 8.4–10.5)
Chloride: 107 mEq/L (ref 96–112)
Creat: 0.71 mg/dL (ref 0.50–1.10)
Glucose, Bld: 108 mg/dL — ABNORMAL HIGH (ref 70–99)
Potassium: 4 mEq/L (ref 3.5–5.3)
Sodium: 140 mEq/L (ref 135–145)
Total Bilirubin: 0.9 mg/dL (ref 0.3–1.2)
Total Protein: 6.8 g/dL (ref 6.0–8.3)

## 2013-10-30 LAB — TSH: TSH: 2.241 u[IU]/mL (ref 0.350–4.500)

## 2013-10-30 MED ORDER — SERTRALINE HCL 50 MG PO TABS: ORAL_TABLET | ORAL | Status: DC

## 2013-10-30 NOTE — Patient Instructions (Addendum)
Followup in one year for annual exam.   Call to Schedule your mammogram  Facilities in [place]: 1)  [facility] [place], [place] Phone: [phone] 2)  [facility], [phone] Phone: [phone] 3)  Dr. Yolanda Bonine at [facility] [address] Phone: [phone]     Mammogram A mammogram is an X-ray test to find changes in a woman's breast. You should get a mammogram if:  You are 39 years of age or older  You have risk factors.   Your doctor recommends that you have one.  BEFORE THE TEST  Do not schedule the test the week before your period, especially if your breasts are sore during this time.  On the day of your mammogram:  Wash your breasts and armpits well. After washing, do not put on any deodorant or talcum powder on until after your test.   Eat and drink as you usually do.   Take your medicines as usual.   If you are diabetic and take insulin, make sure you:   Eat before coming for your test.   Take your insulin as usual.   If you cannot keep your appointment, call before the appointment to cancel. Schedule another appointment.  TEST  You will need to undress from the waist up. You will put on a hospital gown.   Your breast will be put on the mammogram machine, and it will press firmly on your breast with a piece of plastic called a compression paddle. This will make your breast flatter so that the machine can X-ray all parts of your breast.   Both breasts will be X-rayed. Each breast will be X-rayed from above and from the side. An X-ray might need to be taken again if the picture is not good enough.   The mammogram will last about 15 to 30 minutes.  AFTER THE TEST Finding out the results of your test Ask when your test results will be ready. Make sure you get your test results.  Document Released: 03/01/2009 Document Revised: 11/22/2011 Document  Reviewed: 03/01/2009 [facility] Patient Information 2012 [place], [place].

## 2013-10-30 NOTE — Progress Notes (Signed)
Grace Wells 16-Dec-1961 [id number]        52 y.o.  G2P2 for annual exam.  Several issues noted below.  Past medical history,surgical history, problem list, medications, allergies, family history and social history were all reviewed and documented in the EPIC chart.  ROS:  Performed and pertinent positives and negatives are included in the history, assessment and plan .  Exam: Kim assistant Filed Vitals:   10/30/13 0801  BP: 136/86  Height: 5\' 5"  (1.651 m)  Weight: 253 lb (114.76 kg)   General appearance  Normal Skin grossly normal Head/Neck normal with no cervical or supraclavicular adenopathy thyroid normal Lungs  clear Cardiac RR, without RMG Abdominal  soft, nontender, without masses, organomegaly or hernia Breasts  examined lying and sitting without masses, retractions, discharge or axillary adenopathy. Pelvic  Ext/BUS/vagina  second degree rectocele with pouching to the introital opening.   Cervix  normal, IUD string visualized  Uterus  anteverted, normal size, shape and contour, midline and mobile nontender   Adnexa  Without masses or tenderness    Anus and perineum  normal   Rectovaginal  normal sphincter tone without palpated masses or tenderness.    Assessment/Plan:  52 y.o. G2P2 female for annual exam, amenorrheic, Mirena IUD.   1. Mirena IUD 06/2010. Patient doing well without menses. We'll continue to monitor. Not having any symptoms to suggest menopause such as hot flushes night sweats. 2. Rectocele. Patient has a second-degree rectocele. She does note occasional stool trapping requiring pushing with her fingers. Options for management include surgery reviewed. Patient prefers observation will followup if becomes more of an issue. 3. Pap smear/HPV negative 2013. No Pap smear done today. History of LGSIL 2008 through 2010. Normal Pap smears since then 2011, 2012 and 2013 with negative HPV. Plan repeat Pap smear in 3 year interval. 4. Mammography 2013. Patient needs  to schedule now and she agrees to do so. SBE monthly reviewed. 5. History of fatty liver with mild elevated LFTs. Repeat LFTs today. 6. Mild elevated blood pressure at 136/86. Patient will monitor. If remains elevated will followup with her primary physician. 7. Health maintenance. Baseline CBC comprehensive metabolic panel lipid profile TSH urinalysis ordered. Follow up for mammogram otherwise annually.  Note: This document was prepared with digital dictation and possible smart phrase technology. Any transcriptional errors that result from this process are unintentional.   Dara Lords MD, 8:33 AM 10/30/2013

## 2013-11-02 ENCOUNTER — Telehealth: Payer: Self-pay

## 2013-11-02 ENCOUNTER — Other Ambulatory Visit: Payer: Self-pay | Admitting: Gynecology

## 2013-11-02 DIAGNOSIS — N390 Urinary tract infection, site not specified: Secondary | ICD-10-CM

## 2013-11-02 LAB — URINE CULTURE

## 2013-11-02 MED ORDER — NITROFURANTOIN MONOHYD MACRO 100 MG PO CAPS: 100.0000 mg | ORAL_CAPSULE | Freq: Two times a day (BID) | ORAL | Status: DC

## 2013-11-02 NOTE — Telephone Encounter (Signed)
108 is considered borderline. 100-125 is the borderline range. Greater than 126 is considered diabetes.

## 2013-11-02 NOTE — Telephone Encounter (Signed)
Patient informed. 

## 2013-11-02 NOTE — Telephone Encounter (Signed)
I contacted patient regarding UTI. She questioned regarding her blood work results.  She asked specifically about her glucose and on her metabolic panel it was elevated at 108.  She was not concerned but I told her I would get your comment.

## 2013-11-14 ENCOUNTER — Ambulatory Visit (INDEPENDENT_AMBULATORY_CARE_PROVIDER_SITE_OTHER): Payer: Federal, State, Local not specified - PPO | Admitting: Physician Assistant

## 2013-11-14 VITALS — BP 122/70 | HR 72 | Temp 97.8°F | Resp 18 | Wt 254.0 lb

## 2013-11-14 DIAGNOSIS — J329 Chronic sinusitis, unspecified: Secondary | ICD-10-CM

## 2013-11-14 MED ORDER — GUAIFENESIN ER 1200 MG PO TB12: 1.0000 | ORAL_TABLET | Freq: Two times a day (BID) | ORAL | Status: AC

## 2013-11-14 MED ORDER — LEVOFLOXACIN 500 MG PO TABS: 500.0000 mg | ORAL_TABLET | Freq: Every day | ORAL | Status: DC

## 2013-11-14 NOTE — Progress Notes (Signed)
   Subjective:    Patient ID: Grace Wells, female    DOB: [birth year], 52 y.o.   MRN: [id number]  HPI Pt presents to clinic with 3 day h/o cold symptoms.  Started with PND and sore throat - then this am she woke up with pain in her L cheek and eye - yesterday she tried to use a sinus wash and got a lot of green mucus out o the right side but nothing out of the left side.  She has a cough but it is from her PND. OTC - cold preps and allergy meds, saline flush  Review of Systems  Constitutional: Negative for fever and chills.  HENT: Positive for congestion, postnasal drip, rhinorrhea (green), sore throat (worse in the am) and voice change.   Respiratory: Positive for cough.   Musculoskeletal: Negative for myalgias.  Neurological: Negative for headaches.       Objective:   Physical Exam  Vitals reviewed. Constitutional: She is oriented to person, place, and time. She appears well-developed and well-nourished.  HENT:  Head: Normocephalic and atraumatic.  Right Ear: Hearing, tympanic membrane, external ear and ear canal normal.  Left Ear: Hearing, tympanic membrane, external ear and ear canal normal.  Nose: Right sinus exhibits maxillary sinus tenderness. Right sinus exhibits no frontal sinus tenderness. Left sinus exhibits no maxillary sinus tenderness and no frontal sinus tenderness.  Cardiovascular: Normal rate, regular rhythm and normal heart sounds.   No murmur heard. Pulmonary/Chest: Effort normal and breath sounds normal. She has no wheezes.  Neurological: She is alert and oriented to person, place, and time.  Skin: Skin is warm and dry.  Psychiatric: She has a normal mood and affect. Her behavior is normal. Judgment and thought content normal.       Assessment & Plan:  Sinusitis - Plan: levofloxacin (LEVAQUIN) 500 MG tablet, Guaifenesin (MUCINEX MAXIMUM STRENGTH) 1200 MG TB12  Push fluids.  Tylenol/motrin for feeling poorly.  Call if she needs a cough medication for at  night.  Benny Lennert PA-C 11/14/2013 10:03 AM

## 2013-11-24 ENCOUNTER — Encounter: Payer: Federal, State, Local not specified - PPO | Admitting: Gynecology

## 2013-11-26 ENCOUNTER — Encounter: Payer: Self-pay | Admitting: Women's Health

## 2013-11-26 ENCOUNTER — Ambulatory Visit (INDEPENDENT_AMBULATORY_CARE_PROVIDER_SITE_OTHER): Payer: Federal, State, Local not specified - PPO | Admitting: Women's Health

## 2013-11-26 DIAGNOSIS — Z113 Encounter for screening for infections with a predominantly sexual mode of transmission: Secondary | ICD-10-CM

## 2013-11-26 DIAGNOSIS — L738 Other specified follicular disorders: Secondary | ICD-10-CM

## 2013-11-26 DIAGNOSIS — L739 Follicular disorder, unspecified: Secondary | ICD-10-CM

## 2013-11-26 DIAGNOSIS — L678 Other hair color and hair shaft abnormalities: Secondary | ICD-10-CM

## 2013-11-26 MED ORDER — SULFAMETHOXAZOLE-TRIMETHOPRIM 800-160 MG PO TABS: 1.0000 | ORAL_TABLET | Freq: Two times a day (BID) | ORAL | Status: DC

## 2013-11-26 MED ORDER — FLUCONAZOLE 150 MG PO TABS: 150.0000 mg | ORAL_TABLET | Freq: Once | ORAL | Status: DC

## 2013-11-26 NOTE — Patient Instructions (Signed)
Folliculitis  Folliculitis is redness, soreness, and swelling (inflammation) of the hair follicles. This condition can occur anywhere on the body. People with weakened immune systems, diabetes, or obesity have a greater risk of getting folliculitis. CAUSES  Bacterial infection. This is the most common cause.  Fungal infection.  Viral infection.  Contact with certain chemicals, especially oils and tars. Long-term folliculitis can result from bacteria that live in the nostrils. The bacteria may trigger multiple outbreaks of folliculitis over time. SYMPTOMS Folliculitis most commonly occurs on the scalp, thighs, legs, back, buttocks, and areas where hair is shaved frequently. An early sign of folliculitis is a small, white or yellow, pus-filled, itchy lesion (pustule). These lesions appear on a red, inflamed follicle. They are usually less than 0.2 inches (5 mm) wide. When there is an infection of the follicle that goes deeper, it becomes a boil or furuncle. A group of closely packed boils creates a larger lesion (carbuncle). Carbuncles tend to occur in hairy, sweaty areas of the body. DIAGNOSIS  Your caregiver can usually tell what is wrong by doing a physical exam. A sample may be taken from one of the lesions and tested in a lab. This can help determine what is causing your folliculitis. TREATMENT  Treatment may include:  Applying warm compresses to the affected areas.  Taking antibiotic medicines orally or applying them to the skin.  Draining the lesions if they contain a large amount of pus or fluid.  Laser hair removal for cases of long-lasting folliculitis. This helps to prevent regrowth of the hair. HOME CARE INSTRUCTIONS  Apply warm compresses to the affected areas as directed by your caregiver.  If antibiotics are prescribed, take them as directed. Finish them even if you start to feel better.  You may take over-the-counter medicines to relieve itching.  Do not shave  irritated skin.  Follow up with your caregiver as directed. SEEK IMMEDIATE MEDICAL CARE IF:   You have increasing redness, swelling, or pain in the affected area.  You have a fever. MAKE SURE YOU:  Understand these instructions.  Will watch your condition.  Will get help right away if you are not doing well or get worse. Document Released: 02/11/2002 Document Revised: 06/03/2012 Document Reviewed: 03/04/2012 [employer] Patient Information 2014 [employer].  

## 2013-11-26 NOTE — Progress Notes (Signed)
Patient ID: Grace Wells, female   DOB: [birth year], 52 y.o.   MRN: [id number] Presents with complaint of lump on left labia tender to touch started 2 days ago. Also states has a new partner. Widow. Denies urinary symptoms, abdominal pain, vaginal discharge.States has unresolved grief from husband's death, he was unkind and is now dating a kind gentleman. Amenorrheic with Mirena IUD.   Exam: Tearful.  External genitalia 4 cm firm erythematous tender folliculitis. Speculum exam was 2 rectocele, GC/Chlamydia culture taken. Bimanual no CMT or adnexal fullness or tenderness.  Folliculitis STD screen  Plan: Septra DS twice daily for 7 days, prescription, proper use given and reviewed. Diflucan 150 E. symptoms occur. Instructed to call if no relief of symptoms in one week. Declines need for HIV, hepatitis or RPR. Raynelle Fanning Whitt's name and number given, encouraged counseling.

## 2013-11-27 LAB — GC/CHLAMYDIA PROBE AMP
CT Probe RNA: NEGATIVE
GC Probe RNA: NEGATIVE

## 2014-01-10 IMAGING — CR DG CHEST 2V
2 series · 2 of 2 positions shown · non-contrast
Comparison: No priors.

CLINICAL DATA: .History of cough.

CHEST - 2 VIEW

[PA]
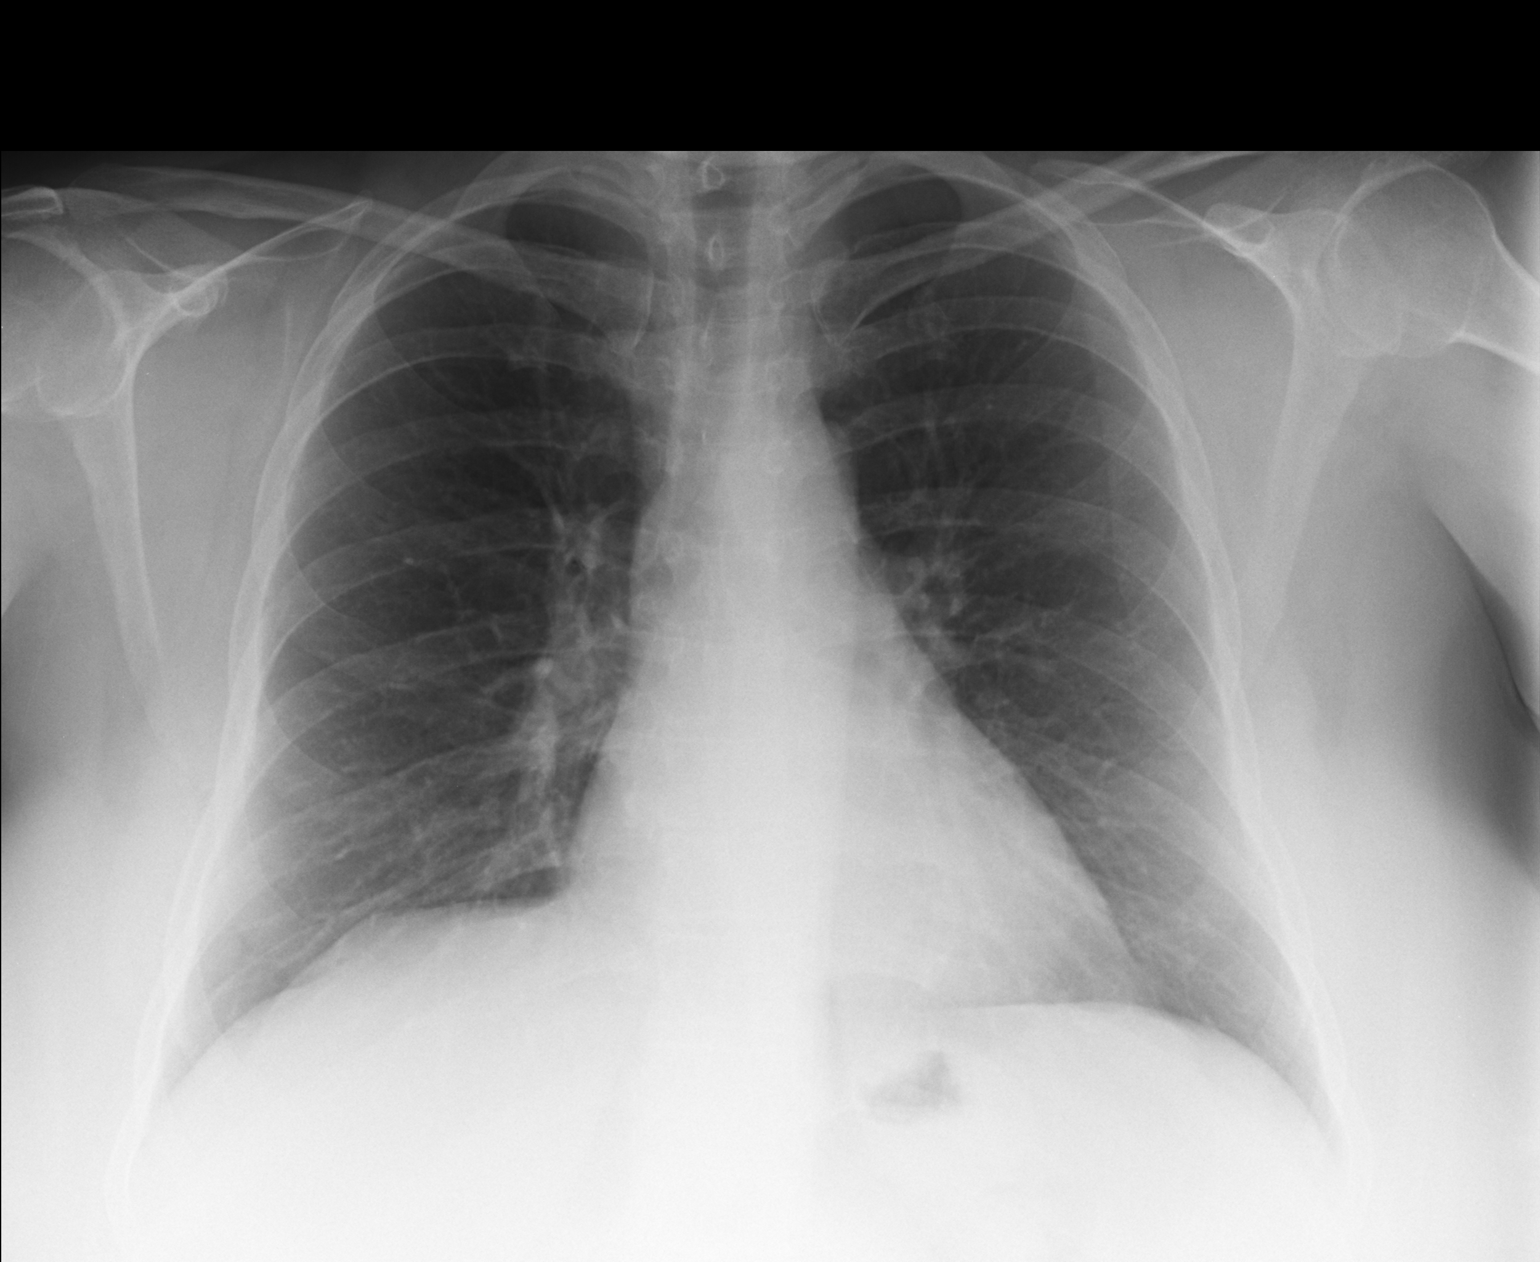

[lateral]
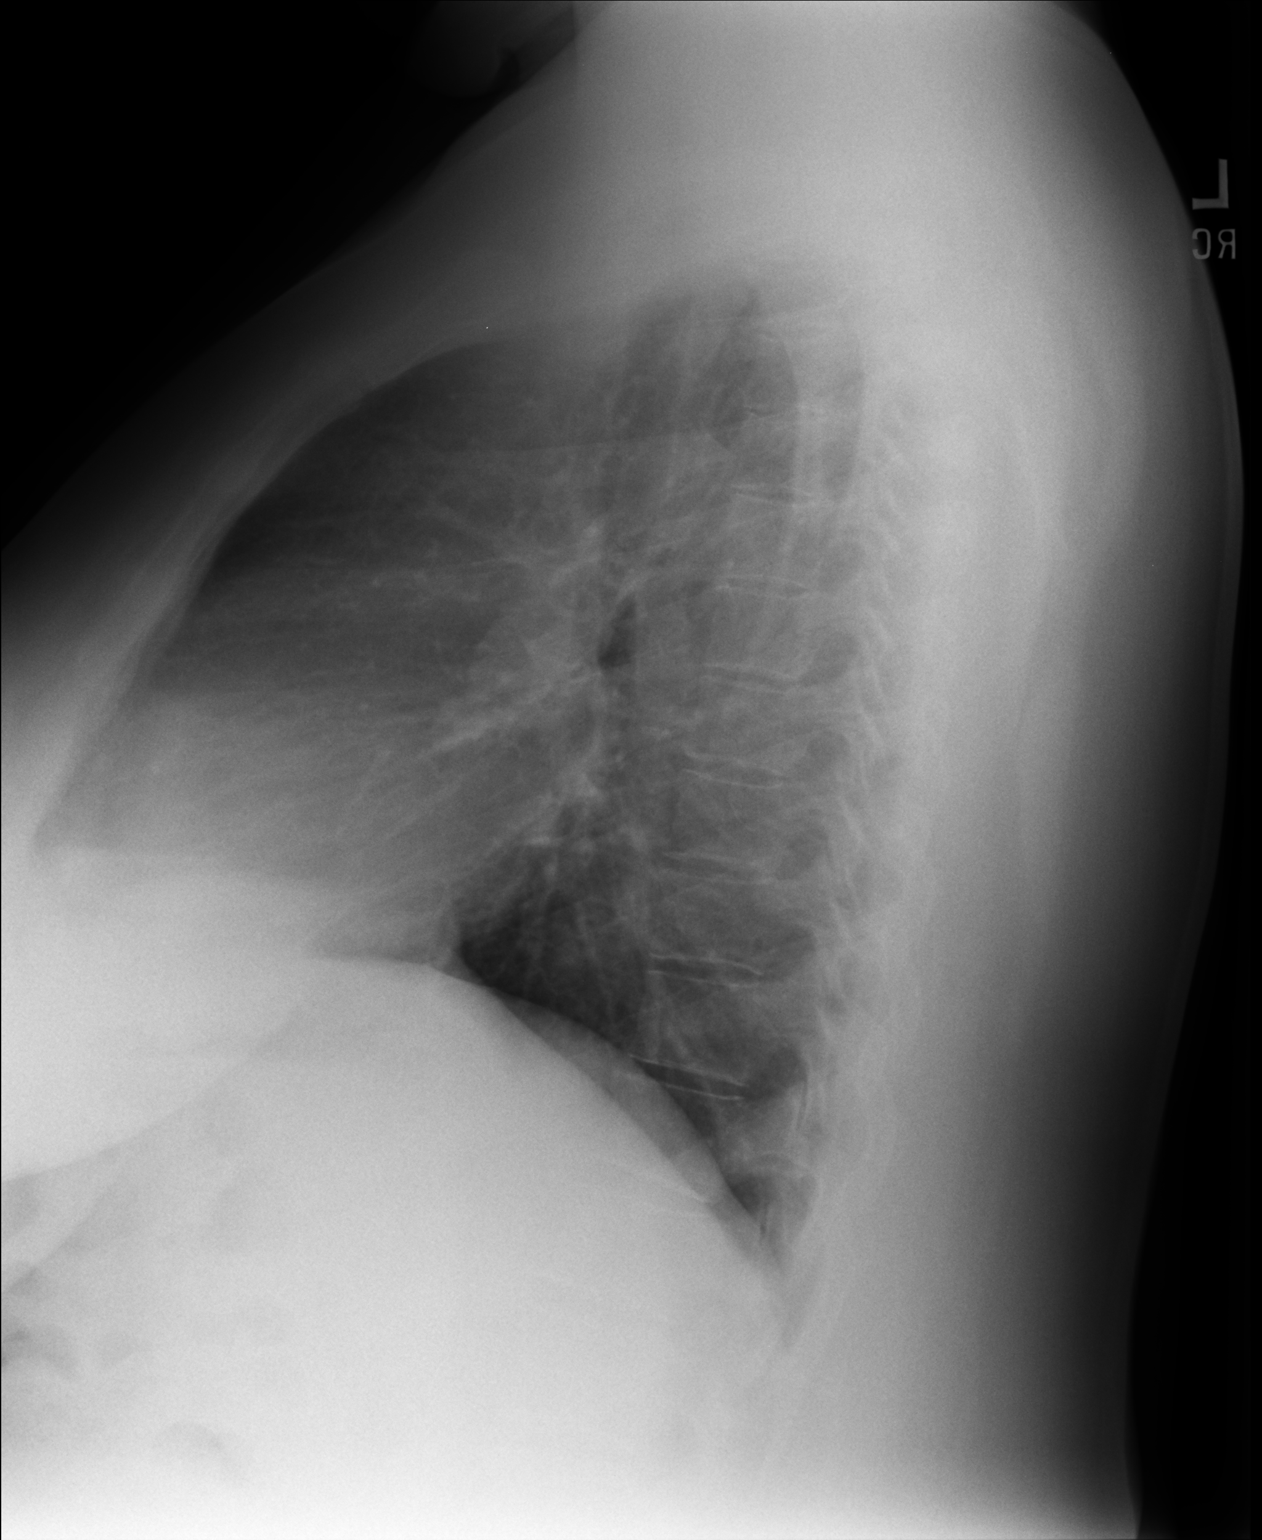

[2 of 2 positions shown; findings below may reference images not displayed]

FINDINGS: Lung volumes are normal.  No consolidative airspace
disease.  No pleural effusions.  No pneumothorax.  No pulmonary
nodule or mass noted.  Pulmonary vasculature and the
cardiomediastinal silhouette are within normal limits.
IMPRESSION: 1. No radiographic evidence of acute cardiopulmonary disease.

Clinically significant discrepancy from primary report, if
provided: None

## 2014-06-28 ENCOUNTER — Encounter: Payer: Self-pay | Admitting: Internal Medicine

## 2014-09-17 ENCOUNTER — Encounter: Payer: Self-pay | Admitting: Gynecology

## 2014-09-17 ENCOUNTER — Ambulatory Visit (INDEPENDENT_AMBULATORY_CARE_PROVIDER_SITE_OTHER): Payer: Federal, State, Local not specified - PPO | Admitting: Gynecology

## 2014-09-17 DIAGNOSIS — A499 Bacterial infection, unspecified: Secondary | ICD-10-CM

## 2014-09-17 DIAGNOSIS — B9689 Other specified bacterial agents as the cause of diseases classified elsewhere: Secondary | ICD-10-CM

## 2014-09-17 DIAGNOSIS — N760 Acute vaginitis: Secondary | ICD-10-CM

## 2014-09-17 DIAGNOSIS — N898 Other specified noninflammatory disorders of vagina: Secondary | ICD-10-CM

## 2014-09-17 LAB — WET PREP FOR TRICH, YEAST, CLUE
Clue Cells Wet Prep HPF POC: NONE SEEN
Trich, Wet Prep: NONE SEEN
Yeast Wet Prep HPF POC: NONE SEEN

## 2014-09-17 MED ORDER — METRONIDAZOLE 500 MG PO TABS: 500.0000 mg | ORAL_TABLET | Freq: Two times a day (BID) | ORAL | Status: DC

## 2014-09-17 NOTE — Patient Instructions (Signed)
Take Flagyl medication twice daily for 7 days. Avoid alcohol while taking. Follow up if your symptoms persist, worsen or recur. Schedule your annual exam in November when due.

## 2014-09-17 NOTE — Progress Notes (Signed)
Grace Wells 08-Feb-1961 [id number]        53 y.o.  G2P2 Presents with one-week history of vaginal discharge. No odor or irritation. No urinary symptoms such as frequency, dysuria or urgency.  Past medical history,surgical history, problem list, medications, allergies, family history and social history were all reviewed and documented in the EPIC chart.  Directed ROS with pertinent positives and negatives documented in the history of present illness/assessment and plan.  Exam: Kim assistant General appearance:  Normal External BUS vagina erythematous with frothy white discharge. First to second-degree rectocele noted.  Cervix grossly normal. Uterus anteverted normal size midline mobile nontender. Adnexa without masses or tenderness.  Assessment/Plan:  53 y.o. G2P2 with history, exam and wet prep consistent with bacterial vaginosis.  Options for treatment reviewed and patient elects Flagyl 500 mg twice a day x7 days, alcohol was reviewed. Follow up if symptoms persist, worsen or recur.  Patient due for annual exam in November and I reminded her to schedule this.     Anastasio Auerbach MD, 11:59 AM 09/17/2014

## 2014-10-18 ENCOUNTER — Encounter: Payer: Self-pay | Admitting: Gynecology

## 2014-11-02 ENCOUNTER — Other Ambulatory Visit: Payer: Self-pay | Admitting: Gynecology

## 2014-11-15 ENCOUNTER — Encounter: Payer: Self-pay | Admitting: Gynecology

## 2014-11-15 ENCOUNTER — Ambulatory Visit (INDEPENDENT_AMBULATORY_CARE_PROVIDER_SITE_OTHER): Payer: Federal, State, Local not specified - PPO | Admitting: Gynecology

## 2014-11-15 ENCOUNTER — Other Ambulatory Visit (HOSPITAL_COMMUNITY)
Admission: RE | Admit: 2014-11-15 | Discharge: 2014-11-15 | Disposition: A | Discharge status: Home / Self Care | Payer: Federal, State, Local not specified - PPO | Source: Ambulatory Visit | Attending: Gynecology | Admitting: Gynecology

## 2014-11-15 VITALS — BP 124/80 | Ht 65.0 in | Wt 240.0 lb

## 2014-11-15 DIAGNOSIS — Z30431 Encounter for routine checking of intrauterine contraceptive device: Secondary | ICD-10-CM

## 2014-11-15 DIAGNOSIS — Z01419 Encounter for gynecological examination (general) (routine) without abnormal findings: Secondary | ICD-10-CM

## 2014-11-15 MED ORDER — SERTRALINE HCL 50 MG PO TABS: ORAL_TABLET | ORAL | Status: DC

## 2014-11-15 NOTE — Addendum Note (Signed)
Addended by: Nelva Nay on: 11/15/2014 04:24 PM   Modules accepted: Orders, SmartSet

## 2014-11-15 NOTE — Patient Instructions (Addendum)
Call to Schedule your mammogram  Facilities in [place]: 1)  [facility], [address] Phone: [phone] 2)  [facility]. [facility], [address] Phone: [phone] 3)  Dr. Bertrand at [facility]  [address] Phone: [phone]     Mammogram A mammogram is an X-ray test to find changes in a woman's breast. You should get a mammogram if:  You are 40 years of age or older  You have risk factors.   Your doctor recommends that you have one.  BEFORE THE TEST  Do not schedule the test the week before your period, especially if your breasts are sore during this time.  On the day of your mammogram:  Wash your breasts and armpits well. After washing, do not put on any deodorant or talcum powder on until after your test.   Eat and drink as you usually do.   Take your medicines as usual.   If you are diabetic and take insulin, make sure you:   Eat before coming for your test.   Take your insulin as usual.   If you cannot keep your appointment, call before the appointment to cancel. Schedule another appointment.  TEST  You will need to undress from the waist up. You will put on a hospital gown.   Your breast will be put on the mammogram machine, and it will press firmly on your breast with a piece of plastic called a compression paddle. This will make your breast flatter so that the machine can X-ray all parts of your breast.   Both breasts will be X-rayed. Each breast will be X-rayed from above and from the side. An X-ray might need to be taken again if the picture is not good enough.   The mammogram will last about 15 to 30 minutes.  AFTER THE TEST Finding out the results of your test Ask when your test results will be ready. Make sure you get your test results.  Document Released: 03/01/2009 Document Revised: 11/22/2011 Document Reviewed: 03/01/2009 [employer] Patient  Information 2012 [employer].   You may obtain a copy of any labs that were done today by logging onto MyChart as outlined in the instructions provided with your AVS (after visit summary). The office will not call with normal lab results but certainly if there are any significant abnormalities then we will contact you.   Health Maintenance, Female A healthy lifestyle and preventative care can promote health and wellness.  Maintain regular health, dental, and eye exams.  Eat a healthy diet. Foods like vegetables, fruits, whole grains, low-fat dairy products, and lean protein foods contain the nutrients you need without too many calories. Decrease your intake of foods high in solid fats, added sugars, and salt. Get information about a proper diet from your caregiver, if necessary.  Regular physical exercise is one of the most important things you can do for your health. Most adults should get at least 150 minutes of moderate-intensity exercise (any activity that increases your heart rate and causes you to sweat) each week. In addition, most adults need muscle-strengthening exercises on 2 or more days a week.   Maintain a healthy weight. The body mass index (BMI) is a screening tool to identify possible weight problems. It provides an estimate of body fat based on height and weight. Your caregiver can help determine your BMI, and can help you achieve or maintain a healthy weight. For   adults 20 years and older:  A BMI below 18.5 is considered underweight.  A BMI of 18.5 to 24.9 is normal.  A BMI of 25 to 29.9 is considered overweight.  A BMI of 30 and above is considered obese.  Maintain normal blood lipids and cholesterol by exercising and minimizing your intake of saturated fat. Eat a balanced diet with plenty of fruits and vegetables. Blood tests for lipids and cholesterol should begin at age 20 and be repeated every 5 years. If your lipid or cholesterol levels are high, you are over 50,  or you are a high risk for heart disease, you may need your cholesterol levels checked more frequently.Ongoing high lipid and cholesterol levels should be treated with medicines if diet and exercise are not effective.  If you smoke, find out from your caregiver how to quit. If you do not use tobacco, do not start.  Lung cancer screening is recommended for adults aged 55 80 years who are at high risk for developing lung cancer because of a history of smoking. Yearly low-dose computed tomography (CT) is recommended for people who have at least a 30-pack-year history of smoking and are a current smoker or have quit within the past 15 years. A pack year of smoking is smoking an average of 1 pack of cigarettes a day for 1 year (for example: 1 pack a day for 30 years or 2 packs a day for 15 years). Yearly screening should continue until the smoker has stopped smoking for at least 15 years. Yearly screening should also be stopped for people who develop a health problem that would prevent them from having lung cancer treatment.  If you are pregnant, do not drink alcohol. If you are breastfeeding, be very cautious about drinking alcohol. If you are not pregnant and choose to drink alcohol, do not exceed 1 drink per day. One drink is considered to be 12 ounces (355 mL) of beer, 5 ounces (148 mL) of wine, or 1.5 ounces (44 mL) of liquor.  Avoid use of street drugs. Do not share needles with anyone. Ask for help if you need support or instructions about stopping the use of drugs.  High blood pressure causes heart disease and increases the risk of stroke. Blood pressure should be checked at least every 1 to 2 years. Ongoing high blood pressure should be treated with medicines, if weight loss and exercise are not effective.  If you are 55 to 51 years old, ask your caregiver if you should take aspirin to prevent strokes.  Diabetes screening involves taking a blood sample to check your fasting blood sugar level. This  should be done once every 3 years, after age 45, if you are within normal weight and without risk factors for diabetes. Testing should be considered at a younger age or be carried out more frequently if you are overweight and have at least 1 risk factor for diabetes.  Breast cancer screening is essential preventative care for women. You should practice "breast self-awareness." This means understanding the normal appearance and feel of your breasts and may include breast self-examination. Any changes detected, no matter how small, should be reported to a caregiver. Women in their 20s and 30s should have a clinical breast exam (CBE) by a caregiver as part of a regular health exam every 1 to 3 years. After age 40, women should have a CBE every year. Starting at age 40, women should consider having a mammogram (breast X-ray) every year. Women who have   a family history of breast cancer should talk to their caregiver about genetic screening. Women at a high risk of breast cancer should talk to their caregiver about having an MRI and a mammogram every year.  Breast cancer gene (BRCA)-related cancer risk assessment is recommended for women who have family members with BRCA-related cancers. BRCA-related cancers include breast, ovarian, tubal, and peritoneal cancers. Having family members with these cancers may be associated with an increased risk for harmful changes (mutations) in the breast cancer genes BRCA1 and BRCA2. Results of the assessment will determine the need for genetic counseling and BRCA1 and BRCA2 testing.  The Pap test is a screening test for cervical cancer. Women should have a Pap test starting at age 21. Between ages 21 and 29, Pap tests should be repeated every 2 years. Beginning at age 30, you should have a Pap test every 3 years as long as the past 3 Pap tests have been normal. If you had a hysterectomy for a problem that was not cancer or a condition that could lead to cancer, then you no longer  need Pap tests. If you are between ages 65 and 70, and you have had normal Pap tests going back 10 years, you no longer need Pap tests. If you have had past treatment for cervical cancer or a condition that could lead to cancer, you need Pap tests and screening for cancer for at least 20 years after your treatment. If Pap tests have been discontinued, risk factors (such as a new sexual partner) need to be reassessed to determine if screening should be resumed. Some women have medical problems that increase the chance of getting cervical cancer. In these cases, your caregiver may recommend more frequent screening and Pap tests.  The human papillomavirus (HPV) test is an additional test that may be used for cervical cancer screening. The HPV test looks for the virus that can cause the cell changes on the cervix. The cells collected during the Pap test can be tested for HPV. The HPV test could be used to screen women aged 30 years and older, and should be used in women of any age who have unclear Pap test results. After the age of 30, women should have HPV testing at the same frequency as a Pap test.  Colorectal cancer can be detected and often prevented. Most routine colorectal cancer screening begins at the age of 50 and continues through age 75. However, your caregiver may recommend screening at an earlier age if you have risk factors for colon cancer. On a yearly basis, your caregiver may provide home test kits to check for hidden blood in the stool. Use of a small camera at the end of a tube, to directly examine the colon (sigmoidoscopy or colonoscopy), can detect the earliest forms of colorectal cancer. Talk to your caregiver about this at age 50, when routine screening begins. Direct examination of the colon should be repeated every 5 to 10 years through age 75, unless early forms of pre-cancerous polyps or small growths are found.  Hepatitis C blood testing is recommended for all people born from 1945  through 1965 and any individual with known risks for hepatitis C.  Practice safe sex. Use condoms and avoid high-risk sexual practices to reduce the spread of sexually transmitted infections (STIs). Sexually active women aged 25 and younger should be checked for Chlamydia, which is a common sexually transmitted infection. Older women with new or multiple partners should also be tested for Chlamydia. Testing   for other STIs is recommended if you are sexually active and at increased risk.  Osteoporosis is a disease in which the bones lose minerals and strength with aging. This can result in serious bone fractures. The risk of osteoporosis can be identified using a bone density scan. Women ages 19 and over and women at risk for fractures or osteoporosis should discuss screening with their caregivers. Ask your caregiver whether you should be taking a calcium supplement or vitamin D to reduce the rate of osteoporosis.  Menopause can be associated with physical symptoms and risks. Hormone replacement therapy is available to decrease symptoms and risks. You should talk to your caregiver about whether hormone replacement therapy is right for you.  Use sunscreen. Apply sunscreen liberally and repeatedly throughout the day. You should seek shade when your shadow is shorter than you. Protect yourself by wearing long sleeves, pants, a wide-brimmed hat, and sunglasses year round, whenever you are outdoors.  Notify your caregiver of new moles or changes in moles, especially if there is a change in shape or color. Also notify your caregiver if a mole is larger than the size of a pencil eraser.  Stay current with your immunizations. Document Released: 06/18/2011 Document Revised: 03/30/2013 Document Reviewed: 06/18/2011 [facility] Patient Information 2014 [place].

## 2014-11-15 NOTE — Progress Notes (Signed)
Grace Wells 1961-09-12 [id number]        53 y.o.  G2P2 for annual exam.  Several issues noted below.  Past medical history,surgical history, problem list, medications, allergies, family history and social history were all reviewed and documented as reviewed in the EPIC chart.  ROS:  12 system ROS performed with pertinent positives and negatives included in the history, assessment and plan.   Additional significant findings :  none   Exam: Grace Wells Vitals:   11/15/14 1538  BP: 124/80  Height: 5\' 5"  (1.651 m)  Weight: 240 lb (108.863 kg)   General appearance:  Normal affect, orientation and appearance. Skin: Grossly normal HEENT: Without gross lesions.  No cervical or supraclavicular adenopathy. Thyroid normal.  Lungs:  Clear without wheezing, rales or rhonchi Cardiac: RR, without RMG Abdominal:  Soft, nontender, without masses, guarding, rebound, organomegaly or hernia Breasts:  Examined lying and sitting without masses, retractions, discharge or axillary adenopathy. Pelvic:  Ext/BUS/vagina with first to second-degree rectocele.  Cervix normal. IUD string visualized. Pap done  Uterus anteverted, normal size, shape and contour, midline and mobile nontender   Adnexa  Without masses or tenderness    Anus and perineum  Normal   Rectovaginal  Normal sphincter tone without palpated masses or tenderness.    Assessment/Plan:  53 y.o. G2P2 female for annual exam without menses, Mirena IUD.   1. Mirena IUD 06/2010. Due to be replaced this coming summer. Patient is 53 without menses. Not having significant hot flashes or night sweats. Will check [place] today. Patient currently not sexually active. Options to include replacing the IUD, removing it and keeping a menstrual calendar as well as leaving it for another year for menstrual suppression as long as she does not need it for birth control all reviewed. Will follow up for the [facility] results. 2. Overdue for mammogram and I reminded  her. Patient agrees to schedule. SBE monthly reviewed. 3. Colonoscopy 2010. Repeat at their recommended interval. 4. Pap smear/HPV negative 2013.  Pap smear done today.  History of low-grade dysplasia 2008 through 2010. Normal Pap smears afterwards. 5. Depression/anxiety. Patient on Zoloft 50 mg doing well and I refilled her 1 year. 6. Health maintenance. Baseline CBC comprehensive metabolic panel lipid profile urinalysis TSH FSH ordered.  Follow up for lab results and IUD decision.     Anastasio Auerbach MD, 4:10 PM 11/15/2014

## 2014-11-16 LAB — URINALYSIS W MICROSCOPIC + REFLEX CULTURE
Bacteria, UA: NONE SEEN
Bilirubin Urine: NEGATIVE
Casts: NONE SEEN
Crystals: NONE SEEN
Glucose, UA: NEGATIVE mg/dL
Hgb urine dipstick: NEGATIVE
Ketones, ur: NEGATIVE mg/dL
Nitrite: NEGATIVE
Protein, ur: NEGATIVE mg/dL
Specific Gravity, Urine: 1.005 (ref 1.005–1.030)
Squamous Epithelial / LPF: NONE SEEN
Urobilinogen, UA: 0.2 mg/dL (ref 0.0–1.0)
pH: 6 (ref 5.0–8.0)

## 2014-11-16 LAB — CBC WITH DIFFERENTIAL/PLATELET
Basophils Absolute: 0.1 10*3/uL (ref 0.0–0.1)
Basophils Relative: 1 % (ref 0–1)
Eosinophils Absolute: 0.1 10*3/uL (ref 0.0–0.7)
Eosinophils Relative: 1 % (ref 0–5)
HCT: 42.7 % (ref 36.0–46.0)
Hemoglobin: 14.3 g/dL (ref 12.0–15.0)
Lymphocytes Relative: 26 % (ref 12–46)
Lymphs Abs: 1.6 10*3/uL (ref 0.7–4.0)
MCH: 27.7 pg (ref 26.0–34.0)
MCHC: 33.5 g/dL (ref 30.0–36.0)
MCV: 82.8 fL (ref 78.0–100.0)
MPV: 11.6 fL (ref 9.4–12.4)
Monocytes Absolute: 0.5 10*3/uL (ref 0.1–1.0)
Monocytes Relative: 8 % (ref 3–12)
Neutro Abs: 3.8 10*3/uL (ref 1.7–7.7)
Neutrophils Relative %: 64 % (ref 43–77)
Platelets: 215 10*3/uL (ref 150–400)
RBC: 5.16 MIL/uL — ABNORMAL HIGH (ref 3.87–5.11)
RDW: 14.9 % (ref 11.5–15.5)
WBC: 6 10*3/uL (ref 4.0–10.5)

## 2014-11-16 LAB — LIPID PANEL
Cholesterol: 144 mg/dL (ref 0–200)
HDL: 46 mg/dL (ref >39)
LDL Cholesterol: 82 mg/dL (ref 0–99)
Total CHOL/HDL Ratio: 3.1 Ratio
Triglycerides: 80 mg/dL (ref <150)
VLDL: 16 mg/dL (ref 0–40)

## 2014-11-16 LAB — TSH: TSH: 1.988 u[IU]/mL (ref 0.350–4.500)

## 2014-11-16 LAB — COMPREHENSIVE METABOLIC PANEL
ALT: 28 U/L (ref 0–35)
AST: 24 U/L (ref 0–37)
Albumin: 4.4 g/dL (ref 3.5–5.2)
Alkaline Phosphatase: 83 U/L (ref 39–117)
BUN: 8 mg/dL (ref 6–23)
CO2: 29 mEq/L (ref 19–32)
Calcium: 9.5 mg/dL (ref 8.4–10.5)
Chloride: 102 mEq/L (ref 96–112)
Creat: 0.67 mg/dL (ref 0.50–1.10)
Glucose, Bld: 88 mg/dL (ref 70–99)
Potassium: 4.3 mEq/L (ref 3.5–5.3)
Sodium: 139 mEq/L (ref 135–145)
Total Bilirubin: 0.9 mg/dL (ref 0.2–1.2)
Total Protein: 7.3 g/dL (ref 6.0–8.3)

## 2014-11-16 LAB — FOLLICLE STIMULATING HORMONE: FSH: 56.3 m[IU]/mL

## 2014-11-17 LAB — URINE CULTURE
Colony Count: NO GROWTH
Organism ID, Bacteria: NO GROWTH

## 2014-11-17 LAB — CYTOLOGY - PAP

## 2014-12-09 ENCOUNTER — Other Ambulatory Visit: Payer: Self-pay | Admitting: Gynecology

## 2014-12-21 ENCOUNTER — Encounter: Payer: Self-pay | Admitting: Gynecology

## 2015-06-01 ENCOUNTER — Encounter: Payer: Self-pay | Admitting: Gastroenterology

## 2015-09-20 ENCOUNTER — Encounter: Payer: Self-pay | Admitting: Emergency Medicine

## 2015-12-29 ENCOUNTER — Other Ambulatory Visit: Payer: Self-pay | Admitting: Gynecology

## 2016-01-04 ENCOUNTER — Encounter: Payer: Self-pay | Admitting: Gynecology

## 2016-01-04 ENCOUNTER — Ambulatory Visit (INDEPENDENT_AMBULATORY_CARE_PROVIDER_SITE_OTHER): Payer: Federal, State, Local not specified - PPO | Admitting: Gynecology

## 2016-01-04 VITALS — BP 120/76 | Ht 65.0 in | Wt 254.0 lb

## 2016-01-04 DIAGNOSIS — Z30432 Encounter for removal of intrauterine contraceptive device: Secondary | ICD-10-CM

## 2016-01-04 DIAGNOSIS — Z1329 Encounter for screening for other suspected endocrine disorder: Secondary | ICD-10-CM

## 2016-01-04 DIAGNOSIS — Z01419 Encounter for gynecological examination (general) (routine) without abnormal findings: Secondary | ICD-10-CM

## 2016-01-04 DIAGNOSIS — N816 Rectocele: Secondary | ICD-10-CM | POA: Diagnosis not present

## 2016-01-04 DIAGNOSIS — Z13220 Encounter for screening for lipoid disorders: Secondary | ICD-10-CM

## 2016-01-04 DIAGNOSIS — Z30431 Encounter for routine checking of intrauterine contraceptive device: Secondary | ICD-10-CM

## 2016-01-04 LAB — CBC WITH DIFFERENTIAL/PLATELET
Basophils Absolute: 0 10*3/uL (ref 0.0–0.1)
Basophils Relative: 0 % (ref 0–1)
Eosinophils Absolute: 0.1 10*3/uL (ref 0.0–0.7)
Eosinophils Relative: 1 % (ref 0–5)
HCT: 42.4 % (ref 36.0–46.0)
Hemoglobin: 14.5 g/dL (ref 12.0–15.0)
Lymphocytes Relative: 32 % (ref 12–46)
Lymphs Abs: 2.1 10*3/uL (ref 0.7–4.0)
MCH: 29.1 pg (ref 26.0–34.0)
MCHC: 34.2 g/dL (ref 30.0–36.0)
MCV: 85 fL (ref 78.0–100.0)
MPV: 11.7 fL (ref 8.6–12.4)
Monocytes Absolute: 0.5 10*3/uL (ref 0.1–1.0)
Monocytes Relative: 7 % (ref 3–12)
Neutro Abs: 4 10*3/uL (ref 1.7–7.7)
Neutrophils Relative %: 60 % (ref 43–77)
Platelets: 224 10*3/uL (ref 150–400)
RBC: 4.99 MIL/uL (ref 3.87–5.11)
RDW: 14.7 % (ref 11.5–15.5)
WBC: 6.7 10*3/uL (ref 4.0–10.5)

## 2016-01-04 MED ORDER — SERTRALINE HCL 50 MG PO TABS: ORAL_TABLET | ORAL | Status: DC

## 2016-01-04 NOTE — Patient Instructions (Signed)

## 2016-01-04 NOTE — Progress Notes (Signed)
Grace Wells 01/24/1961 [id number]        55 y.o.  G2P2  for annual exam.  Several issues noted below.  Past medical history,surgical history, problem list, medications, allergies, family history and social history were all reviewed and documented as reviewed in the EPIC chart.  ROS:  Performed with pertinent positives and negatives included in the history, assessment and plan.   Additional significant findings :  none   Exam: [name] assistant Filed Vitals:   01/04/16 1603  BP: 120/76  Height: 5\' 5"  (1.651 m)  Weight: 254 lb (115.214 kg)   General appearance:  Normal affect, orientation and appearance. Skin: Grossly normal HEENT: Without gross lesions.  No cervical or supraclavicular adenopathy. Thyroid normal.  Lungs:  Clear without wheezing, rales or rhonchi Cardiac: RR, without RMG Abdominal:  Soft, nontender, without masses, guarding, rebound, organomegaly or hernia Breasts:  Examined lying and sitting without masses, retractions, discharge or axillary adenopathy. Pelvic:  Ext/BUS/vagina normal with second degree rectocele  Cervix normal with IUD string visualized  Uterus anteverted, normal size, shape and contour, midline and mobile nontender   Adnexa  Without masses or tenderness    Anus and perineum  Normal   Rectovaginal  Normal sphincter tone without palpated masses or tenderness.   Procedure: Her IUD string was visualized, grasped with the [facility] forcep in her Mirena IUD was removed, shown to the patient and discarded  Assessment/Plan:  55 y.o. G2P2 female for annual exam without menses, Mirena IUD.   1. Mirena IUD 06/2010. Was due to be removed this past summer. Had [facility] checked last year and was 77. Having occasional hot flushes. Currently not sexually active. IUD was removed today.  Patient will keep bleeding calendar as long as no further bleeding and otherwise doing well them will follow. If she starts having menses discussed possible replacing the IUD.  Currently not socially active. Need to use condoms for now if she does become sexually active until she remains without a period over this coming year. 2. Mammography overdo and I reminded her to schedule this. Patient agrees to call and schedule. 3. Colonoscopy 2010. Repeat at their recommended interval. 4. Pap smear 2015. No Pap smear done today. History of low-grade dysplasia 2008 through 2010 with normal Pap smears afterwards. 5. Depression/anxiety. Continues on Zoloft 50 mg doing well and I refilled her 1 year. 6. Health maintenance. Baseline CBC, comprehensive metabolic panel, lipid profile, urinalysis, TSH ordered. Follow up in one year, sooner if any issues.   Anastasio Auerbach MD, 4:24 PM 01/04/2016

## 2016-01-05 LAB — COMPREHENSIVE METABOLIC PANEL
ALT: 31 U/L — ABNORMAL HIGH (ref 6–29)
AST: 22 U/L (ref 10–35)
Albumin: 4.4 g/dL (ref 3.6–5.1)
Alkaline Phosphatase: 84 U/L (ref 33–130)
BUN: 10 mg/dL (ref 7–25)
CO2: 26 mmol/L (ref 20–31)
Calcium: 9.3 mg/dL (ref 8.6–10.4)
Chloride: 104 mmol/L (ref 98–110)
Creat: 0.76 mg/dL (ref 0.50–1.05)
Glucose, Bld: 87 mg/dL (ref 65–99)
Potassium: 3.8 mmol/L (ref 3.5–5.3)
Sodium: 139 mmol/L (ref 135–146)
Total Bilirubin: 0.7 mg/dL (ref 0.2–1.2)
Total Protein: 7 g/dL (ref 6.1–8.1)

## 2016-01-05 LAB — URINALYSIS W MICROSCOPIC + REFLEX CULTURE
Bacteria, UA: NONE SEEN [HPF]
Bilirubin Urine: NEGATIVE
Casts: NONE SEEN [LPF]
Crystals: NONE SEEN [HPF]
Glucose, UA: NEGATIVE
Hgb urine dipstick: NEGATIVE
Ketones, ur: NEGATIVE
Nitrite: NEGATIVE
Protein, ur: NEGATIVE
RBC / HPF: NONE SEEN RBC/HPF (ref <=2)
Specific Gravity, Urine: 1.005 (ref 1.001–1.035)
Squamous Epithelial / LPF: NONE SEEN [HPF] (ref <=5)
Yeast: NONE SEEN [HPF]
pH: 5.5 (ref 5.0–8.0)

## 2016-01-05 LAB — LIPID PANEL
Cholesterol: 134 mg/dL (ref 125–200)
HDL: 48 mg/dL (ref >=46)
LDL Cholesterol: 73 mg/dL (ref <130)
Total CHOL/HDL Ratio: 2.8 Ratio (ref <=5.0)
Triglycerides: 63 mg/dL (ref <150)
VLDL: 13 mg/dL (ref <30)

## 2016-01-05 LAB — TSH: TSH: 2.216 u[IU]/mL (ref 0.350–4.500)

## 2016-01-06 LAB — URINE CULTURE
Colony Count: NO GROWTH
Organism ID, Bacteria: NO GROWTH

## 2016-01-12 ENCOUNTER — Encounter: Payer: Self-pay | Admitting: Gynecology

## 2016-04-11 DIAGNOSIS — M6240 Contracture of muscle, unspecified site: Secondary | ICD-10-CM | POA: Diagnosis not present

## 2016-04-11 DIAGNOSIS — M542 Cervicalgia: Secondary | ICD-10-CM | POA: Diagnosis not present

## 2016-04-11 DIAGNOSIS — M9901 Segmental and somatic dysfunction of cervical region: Secondary | ICD-10-CM | POA: Diagnosis not present

## 2016-04-12 DIAGNOSIS — M6240 Contracture of muscle, unspecified site: Secondary | ICD-10-CM | POA: Diagnosis not present

## 2016-04-12 DIAGNOSIS — M9901 Segmental and somatic dysfunction of cervical region: Secondary | ICD-10-CM | POA: Diagnosis not present

## 2016-04-12 DIAGNOSIS — M542 Cervicalgia: Secondary | ICD-10-CM | POA: Diagnosis not present

## 2016-04-12 DIAGNOSIS — M2669 Other specified disorders of temporomandibular joint: Secondary | ICD-10-CM | POA: Diagnosis not present

## 2016-04-18 ENCOUNTER — Other Ambulatory Visit: Payer: Self-pay

## 2016-04-18 MED ORDER — SERTRALINE HCL 50 MG PO TABS: ORAL_TABLET | ORAL | Status: DC

## 2016-10-10 ENCOUNTER — Ambulatory Visit (INDEPENDENT_AMBULATORY_CARE_PROVIDER_SITE_OTHER): Payer: Federal, State, Local not specified - PPO | Admitting: Family Medicine

## 2016-10-10 VITALS — BP 132/80 | HR 84 | Temp 98.3°F | Resp 17 | Ht 64.0 in | Wt 251.0 lb

## 2016-10-10 DIAGNOSIS — L089 Local infection of the skin and subcutaneous tissue, unspecified: Secondary | ICD-10-CM | POA: Diagnosis not present

## 2016-10-10 MED ORDER — MUPIROCIN 2 % EX OINT: 1.0000 "application " | TOPICAL_OINTMENT | Freq: Two times a day (BID) | CUTANEOUS | 0 refills | Status: DC

## 2016-10-10 MED ORDER — SULFAMETHOXAZOLE-TRIMETHOPRIM 800-160 MG PO TABS: 1.0000 | ORAL_TABLET | Freq: Two times a day (BID) | ORAL | 0 refills | Status: DC

## 2016-10-10 NOTE — Patient Instructions (Addendum)
  Great to meet you!  Take all antibiotics, try warm compresses 3-4 times per day.      IF you received an x-ray today, you will receive an invoice from [facility]. Please contact [facility] at [phone] with questions or concerns regarding your invoice.   IF you received labwork today, you will receive an invoice from [employer]. Please contact [facility] at [phone] with questions or concerns regarding your invoice.   Our billing staff will not be able to assist you with questions regarding bills from these companies.  You will be contacted with the lab results as soon as they are available. The fastest way to get your results is to activate your My Chart account. Instructions are located on the last page of this paperwork. If you have not heard from [place] regarding the results in 2 weeks, please contact this office.

## 2016-10-10 NOTE — Progress Notes (Signed)
   HPI  Patient presents today here with a sore on her lip.  Patient states it started out as a small whitehead on her left lower lip about 7 days ago, he gradually got worse and she began trying to pop it. About 2 days ago she lanced it with a needle producing a small amount of purulent fluid.  Following that she had drastic worsening of that lesion with some induration and discomfort.  Is distinct and different than a cold sore.  It's painful.  She denies any fevers, chills, sweats, or difficulty tolerating foods or fluids  PMH: Smoking status noted ROS: Per HPI  Objective: BP 132/80 (BP Location: Right Arm, Patient Position: Sitting, Cuff Size: Large)   Pulse 84   Temp 98.3 F (36.8 C) (Oral)   Resp 17   Ht 5\' 4"  (1.626 m)   Wt 251 lb (113.9 kg)   SpO2 97%   BMI 43.08 kg/m  Gen: NAD, alert, cooperative with exam HEENT: NCAT, left lower lip with erythema approximately 1 cm x 1 cm with induration extending out about 2.5 cm in diameter from the lip edge. Centrally located there is a erythematous papule with a small punctate lesion, no discharge. No fluctuance CV: RRR, good S1/S2, no murmur Resp: CTABL, no wheezes, non-labored Ext: No edema, warm Neuro: Alert and oriented, No gross deficits  Assessment and plan:  # Skin infection Left lower lip infection, no concern for abscess considering no fluctuance. There is no drainage today. There is a large area of surrounding induration Cover with Bactrim, mupirocin ointment Recommended warm compresses Return to clinic with any concerns or worsening symptoms.   Meds ordered this encounter  Medications  . sulfamethoxazole-trimethoprim (BACTRIM DS) 800-160 MG tablet    Sig: Take 1 tablet by mouth 2 (two) times daily.    Dispense:  14 tablet    Refill:  0  . mupirocin ointment (BACTROBAN) 2 %    Sig: Apply 1 application topically 2 (two) times daily.    Dispense:  22 g    Refill:  0    Laroy Apple, MD  10/10/2016,  4:40 PM

## 2016-10-29 DIAGNOSIS — M9901 Segmental and somatic dysfunction of cervical region: Secondary | ICD-10-CM | POA: Diagnosis not present

## 2016-10-29 DIAGNOSIS — M2669 Other specified disorders of temporomandibular joint: Secondary | ICD-10-CM | POA: Diagnosis not present

## 2016-10-29 DIAGNOSIS — M6240 Contracture of muscle, unspecified site: Secondary | ICD-10-CM | POA: Diagnosis not present

## 2016-10-29 DIAGNOSIS — M542 Cervicalgia: Secondary | ICD-10-CM | POA: Diagnosis not present

## 2016-10-31 DIAGNOSIS — M6240 Contracture of muscle, unspecified site: Secondary | ICD-10-CM | POA: Diagnosis not present

## 2016-10-31 DIAGNOSIS — M9901 Segmental and somatic dysfunction of cervical region: Secondary | ICD-10-CM | POA: Diagnosis not present

## 2016-10-31 DIAGNOSIS — M542 Cervicalgia: Secondary | ICD-10-CM | POA: Diagnosis not present

## 2016-10-31 DIAGNOSIS — M2669 Other specified disorders of temporomandibular joint: Secondary | ICD-10-CM | POA: Diagnosis not present

## 2016-12-28 ENCOUNTER — Ambulatory Visit (INDEPENDENT_AMBULATORY_CARE_PROVIDER_SITE_OTHER): Payer: Federal, State, Local not specified - PPO | Admitting: Physician Assistant

## 2016-12-28 VITALS — BP 140/82 | HR 97 | Temp 98.8°F | Resp 18 | Ht 64.0 in | Wt 251.0 lb

## 2016-12-28 DIAGNOSIS — L089 Local infection of the skin and subcutaneous tissue, unspecified: Secondary | ICD-10-CM

## 2016-12-28 MED ORDER — GUAIFENESIN ER 1200 MG PO TB12: 1.0000 | ORAL_TABLET | Freq: Two times a day (BID) | ORAL | 1 refills | Status: DC | PRN

## 2016-12-28 MED ORDER — CLINDAMYCIN HCL 300 MG PO CAPS: 300.0000 mg | ORAL_CAPSULE | Freq: Four times a day (QID) | ORAL | 0 refills | Status: AC

## 2016-12-28 NOTE — Patient Instructions (Addendum)
Take antibiotic as prescribed. Follow up if your symptoms are not improving.   Clindamycin capsules What is this medicine? CLINDAMYCIN (Panola sin) is a lincosamide antibiotic. It is used to treat certain kinds of bacterial infections. It will not work for colds, flu, or other viral infections. This medicine may be used for other purposes; ask your health care provider or pharmacist if you have questions. COMMON BRAND NAME(S): Cleocin What should I tell my health care provider before I take this medicine? They need to know if you have any of these conditions: -kidney disease -liver disease -stomach problems like colitis -an unusual or allergic reaction to clindamycin, lincomycin, or other medicines, foods, dyes like tartrazine or preservatives -pregnant or trying to get pregnant -breast-feeding How should I use this medicine? Take this medicine by mouth with a full glass of water. Follow the directions on the prescription label. You can take this medicine with food or on an empty stomach. If the medicine upsets your stomach, take it with food. Take your medicine at regular intervals. Do not take your medicine more often than directed. Take all of your medicine as directed even if you think your are better. Do not skip doses or stop your medicine early. Talk to your pediatrician regarding the use of this medicine in children. Special care may be needed. Overdosage: If you think you have taken too much of this medicine contact a poison control center or emergency room at once. NOTE: This medicine is only for you. Do not share this medicine with others. What if I miss a dose? If you miss a dose, take it as soon as you can. If it is almost time for your next dose, take only that dose. Do not take double or extra doses. What may interact with this medicine? -birth control pills -erythromycin -medicines that relax muscles for surgery -rifampin This list may not describe all possible  interactions. Give your health care provider a list of all the medicines, herbs, non-prescription drugs, or dietary supplements you use. Also tell them if you smoke, drink alcohol, or use illegal drugs. Some items may interact with your medicine. What should I watch for while using this medicine? Tell your doctor or healthcare professional if your symptoms do not start to get better or if they get worse. Do not treat diarrhea with over the counter products. Contact your doctor if you have diarrhea that lasts more than 2 days or if it is severe and watery. What side effects may I notice from receiving this medicine? Side effects that you should report to your doctor or health care professional as soon as possible: -allergic reactions like skin rash, itching or hives, swelling of the face, lips, or tongue -dark urine -pain on swallowing -redness, blistering, peeling or loosening of the skin, including inside the mouth -unusual bleeding or bruising -unusually weak or tired -yellowing of eyes or skin Side effects that usually do not require medical attention (report to your doctor or health care professional if they continue or are bothersome): -diarrhea -itching in the rectal or genital area -joint pain -nausea, vomiting -stomach pain This list may not describe all possible side effects. Call your doctor for medical advice about side effects. You may report side effects to FDA at 1-800-FDA-1088. Where should I keep my medicine? Keep out of the reach of children. Store at room temperature between 20 and 25 degrees C (68 and 77 degrees F). Throw away any unused medicine after the expiration date. NOTE:  This sheet is a summary. It may not cover all possible information. If you have questions about this medicine, talk to your doctor, pharmacist, or health care provider.  2017 Elsevier/Gold Standard (2016-03-07 16:34:00)    IF you received an x-ray today, you will receive an invoice from  [facility]. Please contact [facility] at [phone] with questions or concerns regarding your invoice.   IF you received labwork today, you will receive an invoice from [facility]. Please contact [facility] at [phone] with questions or concerns regarding your invoice.   Our billing staff will not be able to assist you with questions regarding bills from these companies.  You will be contacted with the lab results as soon as they are available. The fastest way to get your results is to activate your My Chart account. Instructions are located on the last page of this paperwork. If you have not heard from [place] regarding the results in 2 weeks, please contact this office.

## 2016-12-28 NOTE — Progress Notes (Signed)
[facility] [address] [place] [address] [phone]  Date:  12/28/2016   Name:  Grace Wells   DOB:  04/03/1961   MRN:  [id number]  PCP:  Jenny Reichmann, MD    History of Present Illness:  Grace Wells is a 56 y.o. female patient who presents to [facility] for sinus and tooth pain.    He had tooth pain with abscess more than 10 days ago.  She was placed on an antibiotic, clindamycin.  She has sinus pain on the right side of her face.  She has right eye pain.  She has a sensation of something stuck in her nose. She has some congestion.  Tooth pain has resolved.   She has some right pain.  No pain with ocular movement.  No fever.   She was given the clindamycin 150mg  4 times daily for about 7 days.   Patient Active Problem List   Diagnosis Date Noted  . Rectocele   . DIVERTICULOSIS, COLON 08/12/2009  . FATTY LIVER DISEASE 08/12/2009  . OBESITY 07/18/2009  . HEMORRHOIDS 07/15/2009  . GERD 07/15/2009  . HIATAL HERNIA 07/15/2009  . IRRITABLE BOWEL SYNDROME 07/15/2009  . ABDOMINAL PAIN, LOWER 07/15/2009  . TRANSAMINASES, SERUM, ELEVATED 07/15/2009  . NEPHROLITHIASIS, HX OF 07/15/2009    Past Medical History:  Diagnosis Date  . Anxiety   . Cervical dysplasia    LGSIL C&B 6/08, LGSIL pap12/09 & 6/10, WNL after  . Diverticulitis   . Fatty liver 07/2012   mild elevated LFTs  . GERD (gastroesophageal reflux disease)   . Obesity   . Rectocele 2014    Past Surgical History:  Procedure Laterality Date  . COLPOSCOPY    . INTRAUTERINE DEVICE INSERTION  06/2010   Mirena  . ROOT CANAL      Social History  Substance Use Topics  . Smoking status: Never Smoker  . Smokeless tobacco: Never Used  . Alcohol use No    Family History  Problem Relation Age of Onset  . Diabetes Maternal Grandmother   . Emphysema Maternal Grandmother   . Cancer Paternal Grandmother     colon  . Heart disease Paternal Grandfather     stroke  . Stroke Paternal  Grandfather   . Cancer Mother     Lymphoma  . Cancer Father     Lung  . Hypertension Brother   . Cancer Maternal Grandfather     Hodgkins    Allergies  Allergen Reactions  . Codeine Nausea And Vomiting  . Penicillins     Fingers turned blue, red splotches, started swelling    Medication list has been reviewed and updated.  Current Outpatient Prescriptions on File Prior to Visit  Medication Sig Dispense Refill  . sertraline (ZOLOFT) 50 MG tablet TAKE 1 TABLET (50 MG TOTAL) BY MOUTH DAILY. 90 tablet 3  . mupirocin ointment (BACTROBAN) 2 % Apply 1 application topically 2 (two) times daily. (Patient not taking: Reported on 12/28/2016) 22 g 0  . sulfamethoxazole-trimethoprim (BACTRIM DS) 800-160 MG tablet Take 1 tablet by mouth 2 (two) times daily. (Patient not taking: Reported on 12/28/2016) 14 tablet 0   No current facility-administered medications on file prior to visit.     ROS ROS otherwise unremarkable unless listed above.   Physical Examination: BP 140/82 (BP Location: Right Arm, Patient Position: Sitting, Cuff Size: Small)   Pulse 97   Temp 98.8 F (37.1 C) (Oral)   Resp 18  Ht 5\' 4"  (1.626 m)   Wt 251 lb (113.9 kg)   SpO2 96%   BMI 43.08 kg/m  Ideal Body Weight: Weight in (lb) to have BMI = 25: 145.3  Physical Exam  Constitutional: She is oriented to person, place, and time. She appears well-developed and well-nourished. No distress.  HENT:  Head: Normocephalic and atraumatic.  Right Ear: External ear normal.  Left Ear: External ear normal.  Nose: Rhinorrhea present. No mucosal edema. Right sinus exhibits maxillary sinus tenderness (just medial to eye.).  Mouth/Throat: No oral lesions. Normal dentition. No dental abscesses. No oropharyngeal exudate, posterior oropharyngeal edema or posterior oropharyngeal erythema.  No facial swelling identified.  Eyes: Conjunctivae and EOM are normal. Pupils are equal, round, and reactive to light.  Cardiovascular: Normal  rate.   Pulmonary/Chest: Effort normal. No respiratory distress.  Neurological: She is alert and oriented to person, place, and time.  Skin: She is not diaphoretic.  Psychiatric: She has a normal mood and affect. Her behavior is normal.     Assessment and Plan: Grace Wells is a 56 y.o. female who is here today for cc of sinus pain. Possibility of soft tissue infection. underdosed if this is the case.  Will have 10 days of clindamycin.  Advised probiotic, and warning symptoms to warrant immediate return.  Warned of c diff.  rtc as needed. Soft tissue infection - Plan: clindamycin (CLEOCIN) 300 MG capsule  Ivar Drape, PA-C [facility] 1/14/201810:14 AM

## 2017-03-17 DIAGNOSIS — R87610 Atypical squamous cells of undetermined significance on cytologic smear of cervix (ASC-US): Secondary | ICD-10-CM

## 2017-03-17 HISTORY — DX: Atypical squamous cells of undetermined significance on cytologic smear of cervix (ASC-US): R87.610

## 2017-03-19 ENCOUNTER — Encounter: Payer: Self-pay | Admitting: Gynecology

## 2017-03-19 DIAGNOSIS — Z1231 Encounter for screening mammogram for malignant neoplasm of breast: Secondary | ICD-10-CM | POA: Diagnosis not present

## 2017-04-03 ENCOUNTER — Ambulatory Visit (INDEPENDENT_AMBULATORY_CARE_PROVIDER_SITE_OTHER): Payer: Federal, State, Local not specified - PPO | Admitting: Gynecology

## 2017-04-03 ENCOUNTER — Encounter: Payer: Self-pay | Admitting: Gynecology

## 2017-04-03 VITALS — BP 130/80 | Ht 65.0 in | Wt 254.0 lb

## 2017-04-03 DIAGNOSIS — E663 Overweight: Secondary | ICD-10-CM | POA: Diagnosis not present

## 2017-04-03 DIAGNOSIS — Z13220 Encounter for screening for lipoid disorders: Secondary | ICD-10-CM | POA: Diagnosis not present

## 2017-04-03 DIAGNOSIS — Z01411 Encounter for gynecological examination (general) (routine) with abnormal findings: Secondary | ICD-10-CM | POA: Diagnosis not present

## 2017-04-03 DIAGNOSIS — R87610 Atypical squamous cells of undetermined significance on cytologic smear of cervix (ASC-US): Secondary | ICD-10-CM | POA: Diagnosis not present

## 2017-04-03 DIAGNOSIS — N816 Rectocele: Secondary | ICD-10-CM | POA: Diagnosis not present

## 2017-04-03 DIAGNOSIS — N952 Postmenopausal atrophic vaginitis: Secondary | ICD-10-CM | POA: Diagnosis not present

## 2017-04-03 DIAGNOSIS — N950 Postmenopausal bleeding: Secondary | ICD-10-CM | POA: Diagnosis not present

## 2017-04-03 LAB — LIPID PANEL
Cholesterol: 138 mg/dL (ref <200)
HDL: 41 mg/dL — ABNORMAL LOW (ref >50)
LDL Cholesterol: 79 mg/dL (ref <100)
Total CHOL/HDL Ratio: 3.4 Ratio (ref <5.0)
Triglycerides: 88 mg/dL (ref <150)
VLDL: 18 mg/dL (ref <30)

## 2017-04-03 LAB — CBC WITH DIFFERENTIAL/PLATELET
Basophils Absolute: 55 cells/uL (ref 0–200)
Basophils Relative: 1 %
Eosinophils Absolute: 110 cells/uL (ref 15–500)
Eosinophils Relative: 2 %
HCT: 43.1 % (ref 35.0–45.0)
Hemoglobin: 14.3 g/dL (ref 11.7–15.5)
Lymphocytes Relative: 34 %
Lymphs Abs: 1870 cells/uL (ref 850–3900)
MCH: 28.1 pg (ref 27.0–33.0)
MCHC: 33.2 g/dL (ref 32.0–36.0)
MCV: 84.7 fL (ref 80.0–100.0)
MPV: 11.4 fL (ref 7.5–12.5)
Monocytes Absolute: 440 cells/uL (ref 200–950)
Monocytes Relative: 8 %
Neutro Abs: 3025 cells/uL (ref 1500–7800)
Neutrophils Relative %: 55 %
Platelets: 198 10*3/uL (ref 140–400)
RBC: 5.09 MIL/uL (ref 3.80–5.10)
RDW: 15 % (ref 11.0–15.0)
WBC: 5.5 10*3/uL (ref 3.8–10.8)

## 2017-04-03 LAB — COMPREHENSIVE METABOLIC PANEL
ALT: 27 U/L (ref 6–29)
AST: 26 U/L (ref 10–35)
Albumin: 4.1 g/dL (ref 3.6–5.1)
Alkaline Phosphatase: 74 U/L (ref 33–130)
BUN: 7 mg/dL (ref 7–25)
CO2: 23 mmol/L (ref 20–31)
Calcium: 9.5 mg/dL (ref 8.6–10.4)
Chloride: 104 mmol/L (ref 98–110)
Creat: 0.82 mg/dL (ref 0.50–1.05)
Glucose, Bld: 85 mg/dL (ref 65–99)
Potassium: 4.2 mmol/L (ref 3.5–5.3)
Sodium: 141 mmol/L (ref 135–146)
Total Bilirubin: 1 mg/dL (ref 0.2–1.2)
Total Protein: 6.9 g/dL (ref 6.1–8.1)

## 2017-04-03 NOTE — Progress Notes (Addendum)
    Grace Wells 1961-09-18 [id number]        56 y.o.  G2P2 for annual exam.  Patient also notes over the past year she's had 2 separate episodes of spotting. Noticed little bit of blood on the tissue paper but no other real bleeding. No discomfort cramping or other symptoms. It has been a number of years since she has had a. Because she has had a Mirena IUD that was removed last year. Her Paxton was 63 in 2015  Past medical history,surgical history, problem list, medications, allergies, family history and social history were all reviewed and documented as reviewed in the EPIC chart.  ROS:  Performed with pertinent positives and negatives included in the history, assessment and plan.   Additional significant findings :  None   Exam: [name] assistant Vitals:   04/03/17 1508  BP: 130/80  Weight: 254 lb (115.2 kg)  Height: 5\' 5"  (1.651 m)   Body mass index is 42.27 kg/m.  General appearance:  Normal affect, orientation and appearance. Skin: Grossly normal HEENT: Without gross lesions.  No cervical or supraclavicular adenopathy. Thyroid normal.  Lungs:  Clear without wheezing, rales or rhonchi Cardiac: RR, without RMG Abdominal:  Soft, nontender, without masses, guarding, rebound, organomegaly or hernia Breasts:  Examined lying and sitting without masses, retractions, discharge or axillary adenopathy. Pelvic:  Ext, BUS, Vagina: With atrophic changes. Second-degree rectocele noted.  Cervix: With atrophic changes. Pap smear done today.  Uterus: Anteverted, normal size, shape and contour, midline and mobile nontender   Adnexa: Without masses or tenderness    Anus and perineum: Normal   Rectovaginal: Normal sphincter tone without palpated masses or tenderness.    Assessment/Plan:  56 y.o. G2P2 female for annual exam.   1. With 2 episodes of spotting here reviewed difficult to gauge when her last menstrual period would have been as she has had a Mirena IUD suppressing her menses  for years. Recommended baseline sonohysterogram now rule out intracavitary abnormalities and she agrees to follow up for this. 2. Rectocele, second-degree. Present for years, overall asymptomatic to the patient and stable on serial exams. Continue to monitor for now. 3. Mammography 03/2017. Continue with annual mammography next year. SBE monthly reviewed. 4. Colonoscopy 2010. Repeat at their recommended interval. 5. Pap smear 2015. Pap smear done today. History of low-grade dysplasia 2000 07/19/2009 with normal Pap smears afterwards. 6. Anxiety/depression. Continues on Zoloft 50 mg daily doing well. Refill 1 year provided. 7. Health maintenance. Baseline CBC, CMP, lipid profile, TSH noting increased weight ordered. Follow up for ultrasound. Follow up 1 year for annual exam   Anastasio Auerbach MD, 3:41 PM 04/03/2017

## 2017-04-03 NOTE — Patient Instructions (Signed)
Follow up for ultrasound as scheduled 

## 2017-04-03 NOTE — Addendum Note (Signed)
Addended by: Nelva Nay on: 04/03/2017 03:58 PM   Modules accepted: Orders

## 2017-04-04 LAB — PAP IG W/ RFLX HPV ASCU

## 2017-04-04 LAB — TSH: TSH: 2.45 mIU/L

## 2017-04-08 ENCOUNTER — Encounter: Payer: Self-pay | Admitting: Gynecology

## 2017-04-08 LAB — HUMAN PAPILLOMAVIRUS, HIGH RISK: HPV DNA High Risk: NOT DETECTED

## 2017-04-09 NOTE — Telephone Encounter (Signed)
ASCUS is the mildest atypia that can be seen on a Pap smear. It could be the result of anything from mild irritation i.e. low-grade infection, atrophic changes or the beginning of dysplasia. A negative HPV screen makes it unlikely that it is of significance and that is why we do not become aggressive with this finding other than to do a Pap smear in a year.  The second part of the question I'm assuming is if she becomes sexually active will she have problems with vaginal atrophy as far as pain or discomfort. We will wait and see if this is an issue and deal with it at that time.

## 2017-04-10 ENCOUNTER — Other Ambulatory Visit: Payer: Self-pay | Admitting: Gynecology

## 2017-04-10 DIAGNOSIS — N950 Postmenopausal bleeding: Secondary | ICD-10-CM

## 2017-04-24 ENCOUNTER — Ambulatory Visit (INDEPENDENT_AMBULATORY_CARE_PROVIDER_SITE_OTHER): Payer: Federal, State, Local not specified - PPO

## 2017-04-24 ENCOUNTER — Encounter: Payer: Self-pay | Admitting: Gynecology

## 2017-04-24 ENCOUNTER — Ambulatory Visit (INDEPENDENT_AMBULATORY_CARE_PROVIDER_SITE_OTHER): Payer: Federal, State, Local not specified - PPO | Admitting: Gynecology

## 2017-04-24 VITALS — BP 124/80

## 2017-04-24 DIAGNOSIS — N950 Postmenopausal bleeding: Secondary | ICD-10-CM

## 2017-04-24 DIAGNOSIS — N858 Other specified noninflammatory disorders of uterus: Secondary | ICD-10-CM | POA: Diagnosis not present

## 2017-04-24 NOTE — Progress Notes (Signed)
    Grace Wells 1961-11-06 [id number]        56 y.o.  G2P2 presents for sonohysterogram due to 2 separate episodes of staining over the past year.  Past medical history,surgical history, problem list, medications, allergies, family history and social history were all reviewed and documented in the EPIC chart.  Directed ROS with pertinent positives and negatives documented in the history of present illness/assessment and plan.  Exam: [place] assistant Vitals:   04/24/17 1000  BP: 124/80   General appearance:  Normal Abdomen soft nontender without masses guarding rebound Pelvic external BUS vagina with mild atrophic changes. Second degree rectocele noted. Cervix atrophic. Uterus grossly normal midline mobile nontender. Adnexa without masses or tenderness.  Ultrasound transvaginal and transabdominal shows uterus retroverted with homogeneous echo pattern. Endometrial echo 1.8 mm. Right ovary with small echo-free avascular cyst 14 x 18 mm. Left ovary normal. Cul-de-sac negative.  Sonohysterogram performed, sterile technique, easy catheter introduction, good distention with no abnormalities. Endometrial sample taken with scant return. Patient tolerated well.  Assessment/Plan:  56 y.o. G2P2 with 2 separate episodes of postmenopausal staining. I suspect this was secondary to atrophic changes given the thin endometrium. Patient will follow up for the biopsy results which I discussed may come back with no tissue or little tissue given the thin endometrium. Patient will call if any further bleeding    Anastasio Auerbach MD, 10:06 AM 04/24/2017

## 2017-04-24 NOTE — Patient Instructions (Addendum)
Office will call you with biopsy results 

## 2017-04-28 ENCOUNTER — Other Ambulatory Visit: Payer: Self-pay | Admitting: Gynecology

## 2017-04-29 NOTE — Telephone Encounter (Signed)
Per 04/03/17 "Anxiety/depression. Continues on Zoloft 50 mg daily doing well. Refill 1 year provided."  Medication was never sent,Rx sent.

## 2017-11-28 DIAGNOSIS — M542 Cervicalgia: Secondary | ICD-10-CM | POA: Diagnosis not present

## 2017-11-28 DIAGNOSIS — M9901 Segmental and somatic dysfunction of cervical region: Secondary | ICD-10-CM | POA: Diagnosis not present

## 2017-11-28 DIAGNOSIS — M2669 Other specified disorders of temporomandibular joint: Secondary | ICD-10-CM | POA: Diagnosis not present

## 2017-11-28 DIAGNOSIS — M6240 Contracture of muscle, unspecified site: Secondary | ICD-10-CM | POA: Diagnosis not present

## 2017-12-02 DIAGNOSIS — M2669 Other specified disorders of temporomandibular joint: Secondary | ICD-10-CM | POA: Diagnosis not present

## 2017-12-02 DIAGNOSIS — M9901 Segmental and somatic dysfunction of cervical region: Secondary | ICD-10-CM | POA: Diagnosis not present

## 2017-12-02 DIAGNOSIS — M542 Cervicalgia: Secondary | ICD-10-CM | POA: Diagnosis not present

## 2017-12-02 DIAGNOSIS — M6240 Contracture of muscle, unspecified site: Secondary | ICD-10-CM | POA: Diagnosis not present

## 2017-12-04 DIAGNOSIS — M9901 Segmental and somatic dysfunction of cervical region: Secondary | ICD-10-CM | POA: Diagnosis not present

## 2017-12-04 DIAGNOSIS — M6240 Contracture of muscle, unspecified site: Secondary | ICD-10-CM | POA: Diagnosis not present

## 2017-12-04 DIAGNOSIS — M542 Cervicalgia: Secondary | ICD-10-CM | POA: Diagnosis not present

## 2017-12-04 DIAGNOSIS — M2669 Other specified disorders of temporomandibular joint: Secondary | ICD-10-CM | POA: Diagnosis not present

## 2017-12-05 DIAGNOSIS — M542 Cervicalgia: Secondary | ICD-10-CM | POA: Diagnosis not present

## 2017-12-05 DIAGNOSIS — M9901 Segmental and somatic dysfunction of cervical region: Secondary | ICD-10-CM | POA: Diagnosis not present

## 2017-12-05 DIAGNOSIS — M6240 Contracture of muscle, unspecified site: Secondary | ICD-10-CM | POA: Diagnosis not present

## 2017-12-05 DIAGNOSIS — M2669 Other specified disorders of temporomandibular joint: Secondary | ICD-10-CM | POA: Diagnosis not present

## 2018-02-24 ENCOUNTER — Other Ambulatory Visit: Payer: Self-pay | Admitting: Gynecology

## 2018-02-24 NOTE — Telephone Encounter (Signed)
No annual scheduled yet.

## 2018-04-28 DIAGNOSIS — M6240 Contracture of muscle, unspecified site: Secondary | ICD-10-CM | POA: Diagnosis not present

## 2018-04-28 DIAGNOSIS — M2669 Other specified disorders of temporomandibular joint: Secondary | ICD-10-CM | POA: Diagnosis not present

## 2018-04-28 DIAGNOSIS — M9901 Segmental and somatic dysfunction of cervical region: Secondary | ICD-10-CM | POA: Diagnosis not present

## 2018-04-28 DIAGNOSIS — M542 Cervicalgia: Secondary | ICD-10-CM | POA: Diagnosis not present

## 2018-05-27 ENCOUNTER — Encounter: Payer: Self-pay | Admitting: Gynecology

## 2018-05-27 DIAGNOSIS — Z1231 Encounter for screening mammogram for malignant neoplasm of breast: Secondary | ICD-10-CM | POA: Diagnosis not present

## 2018-05-30 ENCOUNTER — Ambulatory Visit: Payer: Federal, State, Local not specified - PPO | Admitting: Gynecology

## 2018-05-30 ENCOUNTER — Encounter: Payer: Self-pay | Admitting: Gynecology

## 2018-05-30 VITALS — BP 124/80 | Ht 65.0 in | Wt 255.0 lb

## 2018-05-30 DIAGNOSIS — Z8349 Family history of other endocrine, nutritional and metabolic diseases: Secondary | ICD-10-CM | POA: Diagnosis not present

## 2018-05-30 DIAGNOSIS — Z13220 Encounter for screening for lipoid disorders: Secondary | ICD-10-CM

## 2018-05-30 DIAGNOSIS — R87610 Atypical squamous cells of undetermined significance on cytologic smear of cervix (ASC-US): Secondary | ICD-10-CM | POA: Diagnosis not present

## 2018-05-30 DIAGNOSIS — N816 Rectocele: Secondary | ICD-10-CM | POA: Diagnosis not present

## 2018-05-30 DIAGNOSIS — Z01419 Encounter for gynecological examination (general) (routine) without abnormal findings: Secondary | ICD-10-CM | POA: Diagnosis not present

## 2018-05-30 LAB — CBC WITH DIFFERENTIAL/PLATELET
Basophils Absolute: 49 cells/uL (ref 0–200)
Basophils Relative: 1 %
Eosinophils Absolute: 147 cells/uL (ref 15–500)
Eosinophils Relative: 3 %
HCT: 40.5 % (ref 35.0–45.0)
Hemoglobin: 13.9 g/dL (ref 11.7–15.5)
Lymphs Abs: 1103 cells/uL (ref 850–3900)
MCH: 28.3 pg (ref 27.0–33.0)
MCHC: 34.3 g/dL (ref 32.0–36.0)
MCV: 82.5 fL (ref 80.0–100.0)
MPV: 12 fL (ref 7.5–12.5)
Monocytes Relative: 8.7 %
Neutro Abs: 3175 cells/uL (ref 1500–7800)
Neutrophils Relative %: 64.8 %
Platelets: 217 10*3/uL (ref 140–400)
RBC: 4.91 10*6/uL (ref 3.80–5.10)
RDW: 14 % (ref 11.0–15.0)
Total Lymphocyte: 22.5 %
WBC mixed population: 426 cells/uL (ref 200–950)
WBC: 4.9 10*3/uL (ref 3.8–10.8)

## 2018-05-30 LAB — COMPREHENSIVE METABOLIC PANEL
AG Ratio: 1.7 (calc) (ref 1.0–2.5)
ALT: 30 U/L — ABNORMAL HIGH (ref 6–29)
AST: 28 U/L (ref 10–35)
Albumin: 4.2 g/dL (ref 3.6–5.1)
Alkaline phosphatase (APISO): 96 U/L (ref 33–130)
BUN: 12 mg/dL (ref 7–25)
CO2: 24 mmol/L (ref 20–32)
Calcium: 10 mg/dL (ref 8.6–10.4)
Chloride: 106 mmol/L (ref 98–110)
Creat: 0.8 mg/dL (ref 0.50–1.05)
Globulin: 2.5 g/dL (calc) (ref 1.9–3.7)
Glucose, Bld: 99 mg/dL (ref 65–99)
Potassium: 3.9 mmol/L (ref 3.5–5.3)
Sodium: 140 mmol/L (ref 135–146)
Total Bilirubin: 0.7 mg/dL (ref 0.2–1.2)
Total Protein: 6.7 g/dL (ref 6.1–8.1)

## 2018-05-30 LAB — LIPID PANEL
Cholesterol: 122 mg/dL (ref <200)
HDL: 40 mg/dL — ABNORMAL LOW (ref >50)
LDL Cholesterol (Calc): 67 mg/dL (calc)
Non-HDL Cholesterol (Calc): 82 mg/dL (calc) (ref <130)
Total CHOL/HDL Ratio: 3.1 (calc) (ref <5.0)
Triglycerides: 71 mg/dL (ref <150)

## 2018-05-30 LAB — TSH: TSH: 2.83 mIU/L

## 2018-05-30 NOTE — Progress Notes (Signed)
    Grace Wells 03/18/1961 [id number]        57 y.o.  G2P2 for annual gynecologic exam.  Complaining of a bump in the right perineal region present for several days.  Initially tender but put heat to the area and it has drained and seems to be resolving.  Past medical history,surgical history, problem list, medications, allergies, family history and social history were all reviewed and documented as reviewed in the EPIC chart.  ROS:  Performed with pertinent positives and negatives included in the history, assessment and plan.   Additional significant findings : None   Exam: [name] assistant Vitals:   05/30/18 0807  BP: 124/80  Weight: 255 lb (115.7 kg)  Height: 5\' 5"  (1.651 m)   Body mass index is 42.43 kg/m.  General appearance:  Normal affect, orientation and appearance. Skin: Grossly normal HEENT: Without gross lesions.  No cervical or supraclavicular adenopathy. Thyroid normal.  Lungs:  Clear without wheezing, rales or rhonchi Cardiac: RR, without RMG Abdominal:  Soft, nontender, without masses, guarding, rebound, organomegaly or hernia Breasts:  Examined lying and sitting without masses, retractions, discharge or axillary adenopathy. Pelvic:  Ext, BUS, Vagina: Normal with small nodule right upper inner labia majora consistent with small resolving boil.  Second-degree rectocele noted.  Mild atrophic changes noted  Cervix: Normal with atrophic changes  Uterus: Anteverted, normal size, shape and contour, midline and mobile nontender   Adnexa: Without masses or tenderness    Anus and perineum: Normal   Rectovaginal: Normal sphincter tone without palpated masses or tenderness.    Assessment/Plan:  57 y.o. G2P2 female for annual gynecologic exam.   1. Rectocele, second-degree.  Stable over years time.  Asymptomatic to the patient.  Continue to monitor and report any issues. 2. Small probable resolving boil right upper inner labia majora.  Will monitor and follow-up  if it persists or becomes a recurrent issue. 3. Postmenopausal.  No significant hot flushes, night sweats, vaginal dryness or any bleeding. 4. Mammography 05/2018.  Continue with annual mammography when due.  Breast exam normal today. 5. ASCUS/negative high risk HPV on Pap smear 2018.  Pap smear done today.  History of low-grade dysplasia in the past with normal Pap smears over the last several years. 6. Colonoscopy 2010.  Reminded patient she is coming due this coming year and she will follow-up for this. 7. Health maintenance.  Baseline CBC, CMP, lipid profile ordered.  Also ordered TSH as she has a family history of thyroid dysfunction.  Follow-up in 1 year, sooner as needed.   Anastasio Auerbach MD, 8:58 AM 05/30/2018

## 2018-05-30 NOTE — Addendum Note (Signed)
Addended by: Nelva Nay on: 05/30/2018 09:25 AM   Modules accepted: Orders

## 2018-05-30 NOTE — Patient Instructions (Signed)
Follow up in one year for annual exam 

## 2018-06-02 LAB — PAP IG W/ RFLX HPV ASCU

## 2018-06-09 ENCOUNTER — Other Ambulatory Visit: Payer: Self-pay | Admitting: Gynecology

## 2018-06-23 ENCOUNTER — Ambulatory Visit: Payer: Federal, State, Local not specified - PPO | Admitting: Family Medicine

## 2018-06-23 ENCOUNTER — Other Ambulatory Visit: Payer: Self-pay

## 2018-06-23 ENCOUNTER — Encounter: Payer: Self-pay | Admitting: Family Medicine

## 2018-06-23 VITALS — BP 116/80 | HR 91 | Temp 98.9°F | Resp 17 | Ht 65.0 in | Wt 257.2 lb

## 2018-06-23 DIAGNOSIS — L409 Psoriasis, unspecified: Secondary | ICD-10-CM | POA: Diagnosis not present

## 2018-06-23 DIAGNOSIS — R591 Generalized enlarged lymph nodes: Secondary | ICD-10-CM

## 2018-06-23 MED ORDER — CLOBETASOL PROPIONATE 0.05 % EX SHAM: MEDICATED_SHAMPOO | CUTANEOUS | 0 refills | Status: DC

## 2018-06-23 NOTE — Patient Instructions (Addendum)
Shampoo: Apply thin film to dry scalp once daily; leave in place for 15 minutes, then add water, lather, and rinse thoroughly. Limit treatment to 4 consecutive weeks.     IF you received an x-ray today, you will receive an invoice from [facility]. Please contact [facility] at [phone] with questions or concerns regarding your invoice.   IF you received labwork today, you will receive an invoice from [facility]. Please contact [facility] at [phone] with questions or concerns regarding your invoice.   Our billing staff will not be able to assist you with questions regarding bills from these companies.  You will be contacted with the lab results as soon as they are available. The fastest way to get your results is to activate your My Chart account. Instructions are located on the last page of this paperwork. If you have not heard from [place] regarding the results in 2 weeks, please contact this office.     Psoriasis Psoriasis is a long-term (chronic) condition of skin inflammation. It occurs because your immune system causes skin cells to form too quickly. As a result, too many skin cells grow and create raised, red patches (plaques) that look silvery on your skin. Plaques may appear anywhere on your body. They can be any size or shape. Psoriasis can come and go. The condition varies from mild to very severe. It cannot be passed from one person to another (not contagious). What are the causes? The cause of psoriasis is not known, but certain factors can make the condition worse. These include:  Damage or trauma to the skin, such as cuts, scrapes, sunburn, and dryness.  Lack of sunlight.  Certain medicines.  Alcohol.  Tobacco use.  Stress.  Infections caused by bacteria or viruses.  What increases the risk? This condition is more likely to develop in:  People with a family history of psoriasis.  People who are Caucasian.  People who are between the ages of  15-7 and 54-74 years old.  What are the signs or symptoms? There are five different types of psoriasis. You can have more than one type of psoriasis during your life. Types are:  Plaque.  Guttate.  Inverse.  Pustular.  Erythrodermic.  Each type of psoriasis has different symptoms.  Plaque psoriasis symptoms include red, raised plaques with a silvery white coating (scale). These plaques may be itchy. Your nails may be pitted and crumbly or fall off.  Guttate psoriasis symptoms include small red spots that often show up on your trunk, arms, and legs. These spots may develop after you have been sick, especially with strep throat.  Inverse psoriasis symptoms include plaques in your underarm area, under your breasts, or on your genitals, groin, or buttocks.  Pustular psoriasis symptoms include pus-filled bumps that are painful, red, and swollen on the palms of your hands or the soles of your feet. You also may feel exhausted, feverish, weak, or have no appetite.  Erythrodermic psoriasis symptoms include bright red skin that may look burned. You may have a fast heartbeat and a body temperature that is too high or too low. You may be itchy or in pain.  How is this diagnosed? Your health care provider may suspect psoriasis based on your symptoms and family history. Your health care provider will also do a physical exam. This may include a procedure to remove a tissue sample (biopsy) for testing. You may also be referred to a health care provider who specializes in skin diseases (dermatologist). How is this  treated? There is no cure for this condition, but treatment can help manage it. Goals of treatment include:  Helping your skin heal.  Reducing itching and inflammation.  Slowing the growth of new skin cells.  Helping your immune system respond better to your skin.  Treatment varies, depending on the severity of your condition. Treatment may include:  Creams or  ointments.  Ultraviolet ray exposure (light therapy). This may include natural sunlight or light therapy in a medical office.  Medicines (systemic therapy). These medicines can help your body better manage skin cell turnover and inflammation. They may be used along with light therapy or ointments. You may also get antibiotic medicines if you have an infection.  Follow these instructions at home: [place] your skin as needed. Only use moisturizers that have been approved by your health care provider.  Apply cool compresses to the affected areas.  Do not scratch your skin. Lifestyle   Do not use tobacco products. This includes cigarettes, chewing tobacco, and e-cigarettes. If you need help quitting, ask your health care provider.  Drink little or no alcohol.  Try techniques for stress reduction, such as meditation or yoga.  Get exposure to the sun as told by your health care provider. Do not get sunburned.  Consider joining a psoriasis support group. Medicines  Take or use over-the-counter and prescription medicines only as told by your health care provider.  If you were prescribed an antibiotic, take or use it as told by your health care provider. Do not stop taking the antibiotic even if your condition starts to improve. General instructions  Keep a journal to help track what triggers an outbreak. Try to avoid any triggers.  See a counselor or social worker if feelings of sadness, frustration, and hopelessness about your condition are interfering with your work and relationships.  Keep all follow-up visits as told by your health care provider. This is important. Contact a health care provider if:  Your pain gets worse.  You have increasing redness or warmth in the affected areas.  You have new or worsening pain or stiffness in your joints.  Your nails start to break easily or pull away from the nail bed.  You have a fever.  You feel depressed. This  information is not intended to replace advice given to you by your health care provider. Make sure you discuss any questions you have with your health care provider. Document Released: 11/30/2000 Document Revised: 05/10/2016 Document Reviewed: 04/20/2015 Elsevier Interactive Patient Education  2018 [employer].

## 2018-06-23 NOTE — Progress Notes (Signed)
Chief Complaint  Patient presents with  . knots on back of neck    left side knot noticed on 7/4 and has gotten bigger, right side knot noticed on last night and pt thinks it might be her psoriasis/eczema.  Left lymph node  behind ear sore x yesterday.   Pt says her eczema or psoriasis acting up.    HPI   Pt here with a knot ont he back of the neck that was first observed on 06/19/18\ Since that time it has enlarged Right side not started last night and patient is concerned that it might be triggered by psoriasis  She also noticed a left lymph node behind her eye that was painful to touch over the past 24 hrs    Past Medical History:  Diagnosis Date  . Anxiety   . ASCUS of cervix with negative high risk HPV 03/2017  . Cervical dysplasia    LGSIL C&B 6/08, LGSIL pap12/09 & 6/10, WNL after  . Diverticulitis   . Fatty liver 07/2012   mild elevated LFTs  . GERD (gastroesophageal reflux disease)   . Obesity   . Rectocele 2014    Current Outpatient Medications  Medication Sig Dispense Refill  . Multiple Vitamin (MULTIVITAMIN) tablet Take 1 tablet by mouth daily.    . sertraline (ZOLOFT) 50 MG tablet TAKE 1 TABLET BY MOUTH DAILY 90 tablet 4  . azithromycin (ZITHROMAX) 250 MG tablet Take 2 tabs PO x 1 dose, then 1 tab PO QD x 4 days 6 tablet 0  . benzonatate (TESSALON) 100 MG capsule Take 1-2 capsules (100-200 mg total) by mouth 3 (three) times daily as needed for cough. 40 capsule 0  . Clobetasol Propionate 0.05 % shampoo Apply thin film to dry scalp once daily, leave on for 15 minutes, lather, then rinse. Limit treatment to 4 consecutive weeks. 118 mL 0   No current facility-administered medications for this visit.     Allergies:  Allergies  Allergen Reactions  . Codeine Nausea And Vomiting  . Penicillins     Fingers turned blue, red splotches, started swelling    Past Surgical History:  Procedure Laterality Date  . COLPOSCOPY    . ROOT CANAL      Social History    Socioeconomic History  . Marital status: Widowed    Spouse name: Not on file  . Number of children: Not on file  . Years of education: Not on file  . Highest education level: Not on file  Occupational History  . Not on file  Social Needs  . Financial resource strain: Not on file  . Food insecurity:    Worry: Not on file    Inability: Not on file  . Transportation needs:    Medical: Not on file    Non-medical: Not on file  Tobacco Use  . Smoking status: Never Smoker  . Smokeless tobacco: Never Used  Substance and Sexual Activity  . Alcohol use: No    Alcohol/week: 0.0 standard drinks  . Drug use: No  . Sexual activity: Not Currently    Comment: 1st intercourse 57 yo-Fewer than 5 partners  Lifestyle  . Physical activity:    Days per week: Not on file    Minutes per session: Not on file  . Stress: Not on file  Relationships  . Social connections:    Talks on phone: Not on file    Gets together: Not on file    Attends religious service: Not on file  Active member of club or organization: Not on file    Attends meetings of clubs or organizations: Not on file    Relationship status: Not on file  Other Topics Concern  . Not on file  Social History Narrative  . Not on file    Family History  Problem Relation Age of Onset  . Diabetes Maternal Grandmother   . Emphysema Maternal Grandmother   . Cancer Paternal Grandmother        colon  . Heart disease Paternal Grandfather        stroke  . Stroke Paternal Grandfather   . Cancer Mother        Lymphoma  . Cancer Father        Lung  . Hypertension Brother   . Cancer Maternal Grandfather        Hodgkins     ROS Review of Systems See HPI Constitution: No fevers or chills No malaise No diaphoresis Skin: see hpi Eyes: no blurry vision, no double vision GU: no dysuria or hematuria Neuro: no dizziness or headaches all others reviewed and negative   Objective: Vitals:   06/23/18 1149  BP: 116/80  Pulse:  91  Resp: 17  Temp: 98.9 F (37.2 C)  TempSrc: Oral  SpO2: 97%  Weight: 257 lb 3.2 oz (116.7 kg)  Height: 5\' 5"  (1.651 m)    Physical Exam Constitutional:      Appearance: Normal appearance. She is normal weight.  HENT:     Head: Normocephalic and atraumatic.  Eyes:     Extraocular Movements: Extraocular movements intact.     Conjunctiva/sclera: Conjunctivae normal.  Pulmonary:     Effort: Pulmonary effort is normal.  Skin:    General: Skin is warm.     Findings: Rash present.  Neurological:     Mental Status: She is alert.  Psychiatric:        Mood and Affect: Mood normal.        Behavior: Behavior normal.        Thought Content: Thought content normal.        Judgment: Judgment normal.       Palpable lymph nodes that are along the cervical chain   Assessment and Plan Kindal was seen today for knots on back of neck.  Diagnoses and all orders for this visit:  Psoriasis  Lymphadenopathy  Other orders -     Clobetasol Propionate 0.05 % shampoo; Apply thin film to dry scalp once daily, leave on for 15 minutes, lather, then rinse. Limit treatment to 4 consecutive weeks.   Return to clinic if symptoms worsen   [place]

## 2018-07-08 ENCOUNTER — Telehealth: Payer: Self-pay | Admitting: Emergency Medicine

## 2018-07-08 DIAGNOSIS — Z9989 Dependence on other enabling machines and devices: Secondary | ICD-10-CM

## 2018-07-08 DIAGNOSIS — G4733 Obstructive sleep apnea (adult) (pediatric): Secondary | ICD-10-CM

## 2018-07-08 NOTE — Telephone Encounter (Signed)
Copied from [facility] [id number]. Topic: Quick Communication - See Telephone Encounter >> Jul 08, 2018  4:58 PM Rutherford Nail, NT wrote: CRM for notification. See Telephone encounter for: 07/08/18. Patient calling and states that she is needing new supplies. States that she was referred back in 03/2013 by Shanon Rosser. Has been seen at [facility] and her last visit was 06/23/18. Would like to know if a prescription could be written for her C-PAP supplies and to possibly pick it up on Thursday 07/10/18. Please advise. CB#: [phone]

## 2018-07-09 ENCOUNTER — Encounter: Payer: Federal, State, Local not specified - PPO | Admitting: Gynecology

## 2018-07-09 NOTE — Telephone Encounter (Signed)
Please advise, she was seen on 06/23/18 with no follow up scheduled

## 2018-07-10 ENCOUNTER — Encounter: Payer: Self-pay | Admitting: Family Medicine

## 2018-07-10 DIAGNOSIS — Z9989 Dependence on other enabling machines and devices: Secondary | ICD-10-CM | POA: Insufficient documentation

## 2018-07-10 DIAGNOSIS — G4733 Obstructive sleep apnea (adult) (pediatric): Secondary | ICD-10-CM | POA: Insufficient documentation

## 2018-07-10 NOTE — Telephone Encounter (Signed)
Patient needs order by tomorrow. She says there was no order in [facility].

## 2018-07-10 NOTE — Telephone Encounter (Signed)
I put an order in as "other" for CPAP supplies. If that does not work then I will do a paper order on on Monday

## 2018-07-10 NOTE — Telephone Encounter (Signed)
Pt states she is going to [facility] in the morning. Not sure if the mychart order will be acceptable. She is asking for a written order be faxed to the DME Co. She will call if she need further assistance. She was informed that Dr. Nolon Rod will not be able to write an order until Monday.

## 2018-07-11 NOTE — Telephone Encounter (Signed)
Please message below: Would like to know if a prescription could be written for her C-PAP supplies

## 2018-07-12 NOTE — Telephone Encounter (Signed)
Can one of you please write a prescription for CPAP supplies on paper prescription and give it to a CMA to gax it for me.  Than k you so much.  Please dispense CPAP supplies. ICD 10 G47.33

## 2018-07-14 NOTE — Telephone Encounter (Signed)
Done - but I cannot find fax number

## 2018-07-14 NOTE — Telephone Encounter (Signed)
Call to pt to obtain fax number for DME company.  Pt states she could be here in a few minutes to pick up order.  Order placed at 104 front desk.

## 2018-07-15 NOTE — Telephone Encounter (Signed)
Spoke with pt via phone an advised we need a fax number to fax the CPAP order.  Pt advises she doesn't have the number on her at the moment but can call me back in 15 mins with the number.   Dgaddy, CMA

## 2018-07-15 NOTE — Telephone Encounter (Signed)
Patient called back with the fax number  [phone]) [phone] Attn Agustin Cree.

## 2018-07-16 NOTE — Telephone Encounter (Signed)
Order sent via fax [phone] attn: Agustin Cree. Dgaddy, CMA

## 2018-07-17 DIAGNOSIS — G4733 Obstructive sleep apnea (adult) (pediatric): Secondary | ICD-10-CM | POA: Diagnosis not present

## 2018-08-31 DIAGNOSIS — G4733 Obstructive sleep apnea (adult) (pediatric): Secondary | ICD-10-CM | POA: Diagnosis not present

## 2018-09-30 DIAGNOSIS — G4733 Obstructive sleep apnea (adult) (pediatric): Secondary | ICD-10-CM | POA: Diagnosis not present

## 2018-10-10 ENCOUNTER — Encounter: Payer: Self-pay | Admitting: Physician Assistant

## 2018-10-10 ENCOUNTER — Ambulatory Visit: Payer: Federal, State, Local not specified - PPO | Admitting: Physician Assistant

## 2018-10-10 ENCOUNTER — Encounter

## 2018-10-10 ENCOUNTER — Other Ambulatory Visit: Payer: Self-pay

## 2018-10-10 VITALS — BP 137/83 | HR 74 | Temp 98.3°F | Resp 20 | Ht 64.96 in | Wt 256.4 lb

## 2018-10-10 DIAGNOSIS — J029 Acute pharyngitis, unspecified: Secondary | ICD-10-CM

## 2018-10-10 DIAGNOSIS — J069 Acute upper respiratory infection, unspecified: Secondary | ICD-10-CM

## 2018-10-10 LAB — POCT RAPID STREP A (OFFICE): Rapid Strep A Screen: NEGATIVE

## 2018-10-10 MED ORDER — AZITHROMYCIN 250 MG PO TABS: ORAL_TABLET | ORAL | 0 refills | Status: DC

## 2018-10-10 MED ORDER — BENZONATATE 100 MG PO CAPS: 100.0000 mg | ORAL_CAPSULE | Freq: Three times a day (TID) | ORAL | 0 refills | Status: DC | PRN

## 2018-10-10 NOTE — Patient Instructions (Addendum)
- We will treat this as a respiratory viral infection.  - I recommend you rest, drink plenty of fluids, eat light meals including soups.  - You may use delsym cough syrup at night for your cough and sore throat, Tessalon pearls during the day.  -Flonase and over the counter nasal saline spray for nasal congestion.  - You may also use Tylenol or ibuprofen over-the-counter for your sore throat. Tea recipe for sore throat: boil water, add 2 inches shaved ginger root, steep 15 minutes, add juice from 2 full lemons, and 2 tbsp honey. - If no improvement in 5 days, I have given you a print out for an antibiotic to take. If you take this and still do not feel well, please return.      If you have lab work done today you will be contacted with your lab results within the next 2 weeks.  If you have not heard from [place] then please contact us. The fastest way to get your results is to register for My Chart.  Upper Respiratory Infection, Adult Most upper respiratory infections (URIs) are caused by a virus. A URI affects the nose, throat, and upper air passages. The most common type of URI is often called "the common cold." Follow these instructions at home:  Take medicines only as told by your doctor.  Gargle warm saltwater or take cough drops to comfort your throat as told by your doctor.  Use a warm mist humidifier or inhale steam from a shower to increase air moisture. This may make it easier to breathe.  Drink enough fluid to keep your pee (urine) clear or pale yellow.  Eat soups and other clear broths.  Have a healthy diet.  Rest as needed.  Go back to work when your fever is gone or your doctor says it is okay. ? You may need to stay home longer to avoid giving your URI to others. ? You can also wear a face mask and wash your hands often to prevent spread of the virus.  Use your inhaler more if you have asthma.  Do not use any tobacco products, including cigarettes, chewing tobacco, or  electronic cigarettes. If you need help quitting, ask your doctor. Contact a doctor if:  You are getting worse, not better.  Your symptoms are not helped by medicine.  You have chills.  You are getting more short of breath.  You have brown or red mucus.  You have yellow or brown discharge from your nose.  You have pain in your face, especially when you bend forward.  You have a fever.  You have puffy (swollen) neck glands.  You have pain while swallowing.  You have white areas in the back of your throat. Get help right away if:  You have very bad or constant: ? Headache. ? Ear pain. ? Pain in your forehead, behind your eyes, and over your cheekbones (sinus pain). ? Chest pain.  You have long-lasting (chronic) lung disease and any of the following: ? Wheezing. ? Long-lasting cough. ? Coughing up blood. ? A change in your usual mucus.  You have a stiff neck.  You have changes in your: ? Vision. ? Hearing. ? Thinking. ? Mood. This information is not intended to replace advice given to you by your health care provider. Make sure you discuss any questions you have with your health care provider. Document Released: 05/21/2008 Document Revised: 08/05/2016 Document Reviewed: 03/10/2014 Elsevier Interactive Patient Education  2018 [employer].  IF you received an x-ray today, you will receive an invoice from [facility]. Please contact [facility] at [phone] with questions or concerns regarding your invoice.   IF you received labwork today, you will receive an invoice from [facility]. Please contact [facility] at [phone] with questions or concerns regarding your invoice.   Our billing staff will not be able to assist you with questions regarding bills from these companies.  You will be contacted with the lab results as soon as they are available. The fastest way to get your results is to activate your My Chart account. Instructions are located  on the last page of this paperwork. If you have not heard from [place] regarding the results in 2 weeks, please contact this office.

## 2018-10-10 NOTE — Progress Notes (Signed)
MRN: [id number] DOB: 17-Mar-1961  Subjective:   Grace Wells is a 57 y.o. female presenting for chief complaint of Cough (X 3-4 days) and Sore Throat (X 3-4 days) .  Reports 4 day history of sinus congestion, rhinorrhea, dry cough (sometimes productive), and sore throat. Has not tried anything with relief. Denies fever, ear pain, difficulty swallowing, wheezing, shortness of breath, chest tightness, chest pain and myalgia, nausea, vomiting, abdominal pain and diarrhea. Has had sick contact with grandson who had strep and coworkers with bronchitis. Has history of seasonal allergies, takes OTC antihistamine, no history of asthma. Patient has not had flu shot this season. Deneis smoking and alcohol use. Denies recent travel.  Denies any other aggravating or relieving factors, no other questions or concerns.  Grace Wells has a current medication list which includes the following prescription(s): clobetasol propionate, multivitamin, and sertraline. Also is allergic to codeine and penicillins.  Grace Wells  has a past medical history of Anxiety, ASCUS of cervix with negative high risk HPV (03/2017), Cervical dysplasia, Diverticulitis, Fatty liver (07/2012), GERD (gastroesophageal reflux disease), Obesity, and Rectocele (2014). Also  has a past surgical history that includes Colposcopy and Root canal.   Objective:   Vitals: BP 137/83   Pulse 74   Temp 98.3 F (36.8 C) (Oral)   Resp 20   Ht 5' 4.96" (1.65 m)   Wt 256 lb 6.4 oz (116.3 kg)   SpO2 95%   BMI 42.72 kg/m   Physical Exam  Constitutional: She is oriented to person, place, and time. She appears well-developed and well-nourished.  HENT:  Head: Normocephalic and atraumatic.  Right Ear: Tympanic membrane, external ear and ear canal normal.  Left Ear: Tympanic membrane, external ear and ear canal normal.  Nose: Mucosal edema present. Right sinus exhibits no maxillary sinus tenderness and no frontal sinus tenderness. Left sinus exhibits  no maxillary sinus tenderness and no frontal sinus tenderness.  Mouth/Throat: Uvula is midline and mucous membranes are normal. Posterior oropharyngeal erythema present. No posterior oropharyngeal edema or tonsillar abscesses. No tonsillar exudate.  Eyes: Conjunctivae are normal.  Neck: Normal range of motion.  Cardiovascular: Normal rate, regular rhythm, normal heart sounds and intact distal pulses.  Pulmonary/Chest: Effort normal and breath sounds normal. She has no decreased breath sounds. She has no wheezes. She has no rhonchi. She has no rales.  Lymphadenopathy:       Head (right side): No submental, no submandibular, no tonsillar, no preauricular, no posterior auricular and no occipital adenopathy present.       Head (left side): No submental, no submandibular, no tonsillar, no preauricular, no posterior auricular and no occipital adenopathy present.    She has no cervical adenopathy.       Right: No supraclavicular adenopathy present.       Left: No supraclavicular adenopathy present.  Neurological: She is alert and oriented to person, place, and time.  Skin: Skin is warm and dry.  Psychiatric: She has a normal mood and affect.  Vitals reviewed.   Results for orders placed or performed in visit on 10/10/18 (from the past 24 hour(s))  POCT rapid strep A     Status: None   Collection Time: 10/10/18  9:08 AM  Result Value Ref Range   Rapid Strep A Screen Negative Negative    Assessment and Plan :  1. Acute upper respiratory infection - Likely viral in etiology d/t reassuring physical exam findings and labs. - Advised supportive care, offered symptomatic relief. - given  Rx for printed abx if no improvement after 5 days or sx worsen. Educated to return to clinic if symptoms worsen, do not improve, or as needed.  - azithromycin (ZITHROMAX) 250 MG tablet; Take 2 tabs PO x 1 dose, then 1 tab PO QD x 4 days  Dispense: 6 tablet; Refill: 0 - benzonatate (TESSALON) 100 MG capsule; Take 1-2  capsules (100-200 mg total) by mouth 3 (three) times daily as needed for cough.  Dispense: 40 capsule; Refill: 0  2. Sore throat - POCT rapid strep A - Culture, Group A Strep   Side effects, risks, benefits, and alternatives of the medications and treatment plan prescribed today were discussed, and patient expressed understanding of the instructions given. No barriers to understanding were identified. Red flags discussed in detail. Pt expressed understanding regarding what to do in case of emergency/urgent symptoms.  Tenna Delaine, PA-C  [facility] 10/10/2018 9:14 AM

## 2018-10-12 LAB — CULTURE, GROUP A STREP: Strep A Culture: NEGATIVE

## 2018-10-14 DIAGNOSIS — M2669 Other specified disorders of temporomandibular joint: Secondary | ICD-10-CM | POA: Diagnosis not present

## 2018-10-14 DIAGNOSIS — M6240 Contracture of muscle, unspecified site: Secondary | ICD-10-CM | POA: Diagnosis not present

## 2018-10-14 DIAGNOSIS — M542 Cervicalgia: Secondary | ICD-10-CM | POA: Diagnosis not present

## 2018-10-14 DIAGNOSIS — M9901 Segmental and somatic dysfunction of cervical region: Secondary | ICD-10-CM | POA: Diagnosis not present

## 2018-10-31 DIAGNOSIS — G4733 Obstructive sleep apnea (adult) (pediatric): Secondary | ICD-10-CM | POA: Diagnosis not present

## 2018-11-30 DIAGNOSIS — G4733 Obstructive sleep apnea (adult) (pediatric): Secondary | ICD-10-CM | POA: Diagnosis not present

## 2018-12-16 DIAGNOSIS — M2669 Other specified disorders of temporomandibular joint: Secondary | ICD-10-CM | POA: Diagnosis not present

## 2018-12-16 DIAGNOSIS — M6240 Contracture of muscle, unspecified site: Secondary | ICD-10-CM | POA: Diagnosis not present

## 2018-12-16 DIAGNOSIS — M9901 Segmental and somatic dysfunction of cervical region: Secondary | ICD-10-CM | POA: Diagnosis not present

## 2018-12-16 DIAGNOSIS — M542 Cervicalgia: Secondary | ICD-10-CM | POA: Diagnosis not present

## 2018-12-31 DIAGNOSIS — M542 Cervicalgia: Secondary | ICD-10-CM | POA: Diagnosis not present

## 2018-12-31 DIAGNOSIS — M2669 Other specified disorders of temporomandibular joint: Secondary | ICD-10-CM | POA: Diagnosis not present

## 2018-12-31 DIAGNOSIS — M9901 Segmental and somatic dysfunction of cervical region: Secondary | ICD-10-CM | POA: Diagnosis not present

## 2018-12-31 DIAGNOSIS — M6240 Contracture of muscle, unspecified site: Secondary | ICD-10-CM | POA: Diagnosis not present

## 2019-01-22 ENCOUNTER — Encounter: Payer: Self-pay | Admitting: Physician Assistant

## 2019-02-06 ENCOUNTER — Ambulatory Visit (INDEPENDENT_AMBULATORY_CARE_PROVIDER_SITE_OTHER): Payer: Federal, State, Local not specified - PPO | Admitting: Physician Assistant

## 2019-02-06 ENCOUNTER — Encounter: Payer: Self-pay | Admitting: Physician Assistant

## 2019-02-06 VITALS — BP 142/82 | Ht 65.0 in | Wt 258.0 lb

## 2019-02-06 DIAGNOSIS — R1310 Dysphagia, unspecified: Secondary | ICD-10-CM

## 2019-02-06 DIAGNOSIS — R142 Eructation: Secondary | ICD-10-CM

## 2019-02-06 DIAGNOSIS — K219 Gastro-esophageal reflux disease without esophagitis: Secondary | ICD-10-CM

## 2019-02-06 DIAGNOSIS — Z1211 Encounter for screening for malignant neoplasm of colon: Secondary | ICD-10-CM

## 2019-02-06 MED ORDER — OMEPRAZOLE 40 MG PO CPDR: 40.0000 mg | DELAYED_RELEASE_CAPSULE | ORAL | 8 refills | Status: DC

## 2019-02-06 MED ORDER — PEG 3350-KCL-NABCB-NACL-NASULF 236 G PO SOLR: 4000.0000 mL | Freq: Once | ORAL | 0 refills | Status: AC

## 2019-02-06 NOTE — Progress Notes (Signed)
Subjective:    Patient ID: Grace Wells, female    DOB: 04-02-1961, 58 y.o.   MRN: [id number]  HPI [name] is a pleasant 58 year old white female, known remotely to GI colonoscopy and EGD done in 2010 per Dr. Sharlett Iles. She comes in today with complaints of intermittent solid food dysphasia which she has been noticing over the past month or so.  She seems to have noticed primarily with pork and bread.  She is not having symptoms with every meal.  She thinks episodes are occurring a couple of times per week she has had a couple of episodes requiring regurgitation.  She has a sense of food sitting in her midesophagus which is uncomfortable.  If she regurgitates there is immediate relief, that time she can stop eating and weight and food may go down.  She denies any regular heartburn or indigestion, she has not been on any regular acid suppression.  He has noted an increase in belching and burping over the past month as well and has had some chest discomfort which she says usually can be relieved by belching. No current complaints of abdominal pain, no changes in bowel habits, no melena or hematochezia.  She occasionally will get a pain deep down in the right pelvis the exacerbated by a lot of walking. Family history is pertinent for colon cancer in her grandmother who was diagnosed at an elderly age. Colonoscopy done August 2010 showed moderate left colon diverticulosis no polyps. EGD 2010 with finding of small hiatal hernia distal esophageal erosions and no stricture noted.  Review of Systems Pertinent positive and negative review of systems were noted in the above HPI section.  All other review of systems was otherwise negative.  Outpatient Encounter Medications as of 02/06/2019  Medication Sig  . AMBULATORY NON FORMULARY MEDICATION Medication Name: Bee Caps 2 tablets by mouth daily  . Clobetasol Propionate 0.05 % shampoo Apply thin film to dry scalp once daily, leave on for 15 minutes,  lather, then rinse. Limit treatment to 4 consecutive weeks.  . Multiple Vitamin (MULTIVITAMIN) tablet Take 1 tablet by mouth daily.  . Multiple Vitamins-Minerals (HAIR/SKIN/NAILS/BIOTIN PO) Take 1 capsule by mouth daily.  . sertraline (ZOLOFT) 50 MG tablet TAKE 1 TABLET BY MOUTH DAILY  . omeprazole (PRILOSEC) 40 MG capsule Take 1 capsule (40 mg total) by mouth every morning. Take 30 minutes before breakfast  . polyethylene glycol (GOLYTELY) 236 g solution Take 4,000 mLs by mouth once for 1 dose.  . [DISCONTINUED] azithromycin (ZITHROMAX) 250 MG tablet Take 2 tabs PO x 1 dose, then 1 tab PO QD x 4 days  . [DISCONTINUED] benzonatate (TESSALON) 100 MG capsule Take 1-2 capsules (100-200 mg total) by mouth 3 (three) times daily as needed for cough.   No facility-administered encounter medications on file as of 02/06/2019.    Allergies  Allergen Reactions  . Codeine Nausea And Vomiting  . Penicillins     Fingers turned blue, red splotches, started swelling   Patient Active Problem List   Diagnosis Date Noted  . OSA on CPAP 07/10/2018  . Rectocele   . DIVERTICULOSIS, COLON 08/12/2009  . FATTY LIVER DISEASE 08/12/2009  . OBESITY 07/18/2009  . HEMORRHOIDS 07/15/2009  . GERD 07/15/2009  . HIATAL HERNIA 07/15/2009  . IRRITABLE BOWEL SYNDROME 07/15/2009  . ABDOMINAL PAIN, LOWER 07/15/2009  . TRANSAMINASES, SERUM, ELEVATED 07/15/2009  . NEPHROLITHIASIS, HX OF 07/15/2009   Social History   Socioeconomic History  . Marital status: Widowed  Spouse name: Not on file  . Number of children: Not on file  . Years of education: Not on file  . Highest education level: Not on file  Occupational History  . Not on file  Social Needs  . Financial resource strain: Not on file  . Food insecurity:    Worry: Not on file    Inability: Not on file  . Transportation needs:    Medical: Not on file    Non-medical: Not on file  Tobacco Use  . Smoking status: Never Smoker  . Smokeless tobacco: Never  Used  Substance and Sexual Activity  . Alcohol use: No    Alcohol/week: 0.0 standard drinks  . Drug use: No  . Sexual activity: Not Currently    Comment: 1st intercourse 58 yo-Fewer than 5 partners  Lifestyle  . Physical activity:    Days per week: Not on file    Minutes per session: Not on file  . Stress: Not on file  Relationships  . Social connections:    Talks on phone: Not on file    Gets together: Not on file    Attends religious service: Not on file    Active member of club or organization: Not on file    Attends meetings of clubs or organizations: Not on file    Relationship status: Not on file  . Intimate partner violence:    Fear of current or ex partner: Not on file    Emotionally abused: Not on file    Physically abused: Not on file    Forced sexual activity: Not on file  Other Topics Concern  . Not on file  Social History Narrative  . Not on file    Ms. Rappaport's family history includes Cancer in her father, maternal grandfather, mother, and paternal grandmother; Diabetes in her maternal grandmother; Emphysema in her maternal grandmother; Heart disease in her paternal grandfather; Hypertension in her brother; Stroke in her paternal grandfather.      Objective:    Vitals:   02/06/19 1032  BP: (!) 142/82    Physical Exam; well-developed white female in no acute distress, pleasant, height 5 foot 5, weight 258, BMI 42.9.  HEENT; nontraumatic normocephalic EOMI PERRLA sclera anicteric oral mucosa moist.  Cardiovascular; regular rate and rhythm with S1-S2 no murmur rub or gallop, Pulmonary ;clear bilaterally, Abdomen; obese, soft, nontender nondistended no palpable mass or hepatosplenomegaly bowel sounds are present.  Rectal; exam not done, Extremities; no clubbing cyanosis or edema skin warm and dry, Neuropsych; alert and oriented, grossly nonfocal mood and affect appropriate       Assessment & Plan:   #58 58 year old white female with 1+ month history of  intermittent solid food dysphasia, and increase in belching and burping as well as intermittent chest discomfort.  Suspect GERD with peptic stricture, rule out esophageal ring, rule out dysmotility, rule out neoplasm  #2 colon cancer screening.  Patient had remote colonoscopy in 2010, no screening after age 60  #3 family history of colon cancer in patient's grandmother # diverticulosis  #5.  Obstructive sleep apnea #6 obesity  Plan; start omeprazole 40 mg p.o. every morning AC breakfast #30/6 refills. Patient will be scheduled for upper endoscopy with possible esophageal dilation and Colonoscopy with Dr. Ardis Hughs.  Both procedures were discussed in detail with the patient including indications risks and benefits and she is agreeable to proceed.  Marsel Gail S Reginna Sermeno PA-C 02/06/2019   Cc: No ref. provider found

## 2019-02-06 NOTE — Patient Instructions (Signed)
If you are age 56 or older, your body mass index should be between 23-30. Your Body mass index is 42.93 kg/m. If this is out of the aforementioned range listed, please consider follow up with your Primary Care Provider.  If you are age 34 or younger, your body mass index should be between 19-25. Your Body mass index is 42.93 kg/m. If this is out of the aformentioned range listed, please consider follow up with your Primary Care Provider.   You have been scheduled for an endoscopy and colonoscopy. Please follow the written instructions given to you at your visit today. Please pick up your prep supplies at the pharmacy within the next 1-3 days. If you use inhalers (even only as needed), please bring them with you on the day of your procedure. Your physician has requested that you go to www.startemmi.com and enter the access code given to you at your visit today. This web site gives a general overview about your procedure. However, you should still follow specific instructions given to you by our office regarding your preparation for the procedure.  We have sent the following medications to your pharmacy for you to pick up at your convenience: Golytely Omeprazole  Thank you for choosing me and [facility].  Amy Esterwood,  PA -C

## 2019-02-09 NOTE — Progress Notes (Signed)
I agree with the above note, plan 

## 2019-02-16 ENCOUNTER — Encounter: Payer: Self-pay | Admitting: Gastroenterology

## 2019-02-16 ENCOUNTER — Ambulatory Visit (AMBULATORY_SURGERY_CENTER): Payer: Federal, State, Local not specified - PPO | Admitting: Gastroenterology

## 2019-02-16 ENCOUNTER — Other Ambulatory Visit: Payer: Self-pay

## 2019-02-16 VITALS — BP 129/78 | HR 61 | Temp 98.9°F | Resp 13 | Ht 65.0 in | Wt 258.0 lb

## 2019-02-16 DIAGNOSIS — Z1211 Encounter for screening for malignant neoplasm of colon: Secondary | ICD-10-CM | POA: Diagnosis not present

## 2019-02-16 DIAGNOSIS — K222 Esophageal obstruction: Secondary | ICD-10-CM | POA: Diagnosis not present

## 2019-02-16 DIAGNOSIS — K449 Diaphragmatic hernia without obstruction or gangrene: Secondary | ICD-10-CM | POA: Diagnosis not present

## 2019-02-16 DIAGNOSIS — D120 Benign neoplasm of cecum: Secondary | ICD-10-CM

## 2019-02-16 DIAGNOSIS — R142 Eructation: Secondary | ICD-10-CM

## 2019-02-16 DIAGNOSIS — R1310 Dysphagia, unspecified: Secondary | ICD-10-CM

## 2019-02-16 DIAGNOSIS — K219 Gastro-esophageal reflux disease without esophagitis: Secondary | ICD-10-CM

## 2019-02-16 MED ORDER — SODIUM CHLORIDE 0.9 % IV SOLN: 500.0000 mL | Freq: Once | INTRAVENOUS | Status: DC

## 2019-02-16 NOTE — Patient Instructions (Signed)
Polyps and Hiatal Hernia info  YOU HAD AN ENDOSCOPIC PROCEDURE TODAY AT THE [facility]:   Refer to the procedure report that was given to you for any specific questions about what was found during the examination.  If the procedure report does not answer your questions, please call your gastroenterologist to clarify.  If you requested that your care partner not be given the details of your procedure findings, then the procedure report has been included in a sealed envelope for you to review at your convenience later.  YOU SHOULD EXPECT: Some feelings of bloating in the abdomen. Passage of more gas than usual.  Walking can help get rid of the air that was put into your GI tract during the procedure and reduce the bloating. If you had a lower endoscopy (such as a colonoscopy or flexible sigmoidoscopy) you may notice spotting of blood in your stool or on the toilet paper. If you underwent a bowel prep for your procedure, you may not have a normal bowel movement for a few days.  Please Note:  You might notice some irritation and congestion in your nose or some drainage.  This is from the oxygen used during your procedure.  There is no need for concern and it should clear up in a day or so.  SYMPTOMS TO REPORT IMMEDIATELY:   Following lower endoscopy (colonoscopy or flexible sigmoidoscopy):  Excessive amounts of blood in the stool  Significant tenderness or worsening of abdominal pains  Swelling of the abdomen that is new, acute  Fever of 100F or higher   Following upper endoscopy (EGD)  Vomiting of blood or coffee ground material  New chest pain or pain under the shoulder blades  Painful or persistently difficult swallowing  New shortness of breath  Fever of 100F or higher  Black, tarry-looking stools  For urgent or emergent issues, a gastroenterologist can be reached at any hour by calling [phone].   DIET:  We do recommend a small meal at first, but then you may  proceed to your regular diet.  Drink plenty of fluids but you should avoid alcoholic beverages for 24 hours.  ACTIVITY:  You should plan to take it easy for the rest of today and you should NOT DRIVE or use heavy machinery until tomorrow (because of the sedation medicines used during the test).    FOLLOW UP: Our staff will call the number listed on your records the next business day following your procedure to check on you and address any questions or concerns that you may have regarding the information given to you following your procedure. If we do not reach you, we will leave a message.  However, if you are feeling well and you are not experiencing any problems, there is no need to return our call.  We will assume that you have returned to your regular daily activities without incident.  If any biopsies were taken you will be contacted by phone or by letter within the next 1-3 weeks.  Please call us at [phone] if you have not heard about the biopsies in 3 weeks.    SIGNATURES/CONFIDENTIALITY: You and/or your care partner have signed paperwork which will be entered into your electronic medical record.  These signatures attest to the fact that that the information above on your After Visit Summary has been reviewed and is understood.  Full responsibility of the confidentiality of this discharge information lies with you and/or your care-partner.

## 2019-02-16 NOTE — Op Note (Signed)
[place] Patient Name: Grace Wells Procedure Date: 02/16/2019 2:12 PM MRN: [id number] Endoscopist: Milus Banister , MD Age: 58 Referring MD:  Date of Birth: 1961/01/07 Gender: Female Account #: [id number] Procedure:                Upper GI endoscopy Indications:              Dysphagia Medicines:                Monitored Anesthesia Care Procedure:                Pre-Anesthesia Assessment:                           - Prior to the procedure, a History and Physical                            was performed, and patient medications and                            allergies were reviewed. The patient's tolerance of                            previous anesthesia was also reviewed. The risks                            and benefits of the procedure and the sedation                            options and risks were discussed with the patient.                            All questions were answered, and informed consent                            was obtained. Prior Anticoagulants: The patient has                            taken no previous anticoagulant or antiplatelet                            agents. ASA Grade Assessment: II - A patient with                            mild systemic disease. After reviewing the risks                            and benefits, the patient was deemed in                            satisfactory condition to undergo the procedure.                           After obtaining informed consent, the endoscope was  passed under direct vision. Throughout the                            procedure, the patient's blood pressure, pulse, and                            oxygen saturations were monitored continuously. The                            Endoscope was introduced through the mouth, and                            advanced to the second part of duodenum. The upper                            GI endoscopy was accomplished without  difficulty.                            The patient tolerated the procedure well. Scope In: Scope Out: Findings:                 One benign-appearing, intrinsic mild stenosis                            (thick Schatzki's ring vs. focal peptic stricture)                            was found at the gastroesophageal junction. A TTS                            dilator was passed through the scope. Dilation with                            an 18-19-20 mm balloon dilator was performed to 20                            mm. There was the typical superficial mucosal tear                            and self limited oozing of blood following dilation.                           Small hiatal hernia.                           The exam was otherwise without abnormality. Complications:            No immediate complications. Estimated blood loss:                            None. Estimated Blood Loss:     Estimated blood loss: none. Impression:               - Benign-appearing esophageal stenosis. Dilated.                           -  Small hiatal hernia.                           - The examination was otherwise normal. Recommendation:           - Patient has a contact number available for                            emergencies. The signs and symptoms of potential                            delayed complications were discussed with the                            patient. Return to normal activities tomorrow.                            Written discharge instructions were provided to the                            patient.                           - Resume previous diet.                           - Continue present medications.                           - Call if your swallowing difficulty returns. Milus Banister, MD 02/16/2019 3:16:40 PM This report has been signed electronically.

## 2019-02-16 NOTE — Op Note (Signed)
[employer] Patient Name: Grace Wells Procedure Date: 02/16/2019 2:12 PM MRN: [id number] Endoscopist: Milus Banister , MD Age: 58 Referring MD:  Date of Birth: March 26, 1961 Gender: Female Account #: [id number] Procedure:                Colonoscopy Indications:              Screening for colorectal malignant neoplasm Medicines:                Monitored Anesthesia Care Procedure:                Pre-Anesthesia Assessment:                           - Prior to the procedure, a History and Physical                            was performed, and patient medications and                            allergies were reviewed. The patient's tolerance of                            previous anesthesia was also reviewed. The risks                            and benefits of the procedure and the sedation                            options and risks were discussed with the patient.                            All questions were answered, and informed consent                            was obtained. Prior Anticoagulants: The patient has                            taken no previous anticoagulant or antiplatelet                            agents. ASA Grade Assessment: II - A patient with                            mild systemic disease. After reviewing the risks                            and benefits, the patient was deemed in                            satisfactory condition to undergo the procedure.                           After obtaining informed consent, the colonoscope  was passed under direct vision. Throughout the                            procedure, the patient's blood pressure, pulse, and                            oxygen saturations were monitored continuously. The                            Colonoscope was introduced through the anus and                            advanced to the the cecum, identified by                            appendiceal orifice and  ileocecal valve. The                            colonoscopy was performed without difficulty. The                            patient tolerated the procedure well. The quality                            of the bowel preparation was good. The ileocecal                            valve, appendiceal orifice, and rectum were                            photographed. Scope In: 2:38:11 PM Scope Out: 2:52:17 PM Scope Withdrawal Time: 0 hours 12 minutes 32 seconds  Total Procedure Duration: 0 hours 14 minutes 6 seconds  Findings:                 A 12 mm polyp was found in the cecum. The polyp was                            sessile. The polyp was removed with a hot snare.                            Resection and retrieval were complete.                           The exam was otherwise without abnormality on                            direct and retroflexion views. Complications:            No immediate complications. Estimated blood loss:                            None. Estimated Blood Loss:     Estimated blood loss: none. Impression:               -  One 12 mm polyp in the cecum, removed with a hot                            snare. Resected and retrieved.                           - The examination was otherwise normal on direct                            and retroflexion views. Recommendation:           - Patient has a contact number available for                            emergencies. The signs and symptoms of potential                            delayed complications were discussed with the                            patient. Return to normal activities tomorrow.                            Written discharge instructions were provided to the                            patient.                           - Resume previous diet.                           - Continue present medications.                           You will receive a letter within 2-3 weeks with the                             pathology results and my final recommendations.                           If the polyp(s) is proven to be 'pre-cancerous' on                            pathology, you will need repeat colonoscopy in 3                            years. If the polyp(s) is NOT 'precancerous' on                            pathology then you should repeat colon cancer                            screening in 10 years with colonoscopy without need  for colon cancer screening by any method prior to                            then (including stool testing). Milus Banister, MD 02/16/2019 3:04:29 PM This report has been signed electronically.

## 2019-02-16 NOTE — Progress Notes (Signed)
To PACU VSS. Report to [email or web address] 

## 2019-02-17 ENCOUNTER — Telehealth: Payer: Self-pay | Admitting: *Deleted

## 2019-02-17 NOTE — Telephone Encounter (Signed)
First follow-up call attempt.  Reached a [employer] account planner.  No message left.

## 2019-02-25 ENCOUNTER — Encounter: Payer: Self-pay | Admitting: Gastroenterology

## 2019-06-14 ENCOUNTER — Other Ambulatory Visit: Payer: Self-pay | Admitting: Gynecology

## 2019-06-15 NOTE — Telephone Encounter (Signed)
Will have appointment desk call and schedule annual.

## 2019-06-15 NOTE — Telephone Encounter (Signed)
Okay for 1 refill.  Needs annual exam

## 2019-06-15 NOTE — Telephone Encounter (Signed)
Rx sent, per TF needs to schedule annual exam

## 2019-06-15 NOTE — Telephone Encounter (Signed)
Left detailed message to call and schedule well visit exam.

## 2019-07-08 DIAGNOSIS — M6240 Contracture of muscle, unspecified site: Secondary | ICD-10-CM | POA: Diagnosis not present

## 2019-07-08 DIAGNOSIS — M9901 Segmental and somatic dysfunction of cervical region: Secondary | ICD-10-CM | POA: Diagnosis not present

## 2019-07-08 DIAGNOSIS — M2669 Other specified disorders of temporomandibular joint: Secondary | ICD-10-CM | POA: Diagnosis not present

## 2019-07-08 DIAGNOSIS — M542 Cervicalgia: Secondary | ICD-10-CM | POA: Diagnosis not present

## 2019-07-09 ENCOUNTER — Encounter: Payer: Self-pay | Admitting: Gynecology

## 2019-07-09 DIAGNOSIS — M2669 Other specified disorders of temporomandibular joint: Secondary | ICD-10-CM | POA: Diagnosis not present

## 2019-07-09 DIAGNOSIS — Z1231 Encounter for screening mammogram for malignant neoplasm of breast: Secondary | ICD-10-CM | POA: Diagnosis not present

## 2019-07-09 DIAGNOSIS — M6240 Contracture of muscle, unspecified site: Secondary | ICD-10-CM | POA: Diagnosis not present

## 2019-07-09 DIAGNOSIS — M9901 Segmental and somatic dysfunction of cervical region: Secondary | ICD-10-CM | POA: Diagnosis not present

## 2019-07-09 DIAGNOSIS — M542 Cervicalgia: Secondary | ICD-10-CM | POA: Diagnosis not present

## 2019-08-03 ENCOUNTER — Ambulatory Visit (INDEPENDENT_AMBULATORY_CARE_PROVIDER_SITE_OTHER): Payer: Federal, State, Local not specified - PPO | Admitting: Gynecology

## 2019-08-03 ENCOUNTER — Encounter: Payer: Self-pay | Admitting: Gynecology

## 2019-08-03 ENCOUNTER — Other Ambulatory Visit: Payer: Self-pay

## 2019-08-03 VITALS — BP 124/80 | Ht 64.0 in | Wt 252.0 lb

## 2019-08-03 DIAGNOSIS — Z13220 Encounter for screening for lipoid disorders: Secondary | ICD-10-CM | POA: Diagnosis not present

## 2019-08-03 DIAGNOSIS — N816 Rectocele: Secondary | ICD-10-CM

## 2019-08-03 DIAGNOSIS — Z01419 Encounter for gynecological examination (general) (routine) without abnormal findings: Secondary | ICD-10-CM

## 2019-08-03 DIAGNOSIS — N952 Postmenopausal atrophic vaginitis: Secondary | ICD-10-CM | POA: Diagnosis not present

## 2019-08-03 DIAGNOSIS — R87610 Atypical squamous cells of undetermined significance on cytologic smear of cervix (ASC-US): Secondary | ICD-10-CM | POA: Diagnosis not present

## 2019-08-03 NOTE — Progress Notes (Signed)
    Grace Wells 09-28-1961 [id number]        58 y.o.  G2P2 for annual gynecologic exam.  Without gynecologic complaints  Past medical history,surgical history, problem list, medications, allergies, family history and social history were all reviewed and documented as reviewed in the EPIC chart.  ROS:  Performed with pertinent positives and negatives included in the history, assessment and plan.   Additional significant findings : None   Exam: [name] assistant Vitals:   08/03/19 1533  BP: 124/80  Weight: 252 lb (114.3 kg)  Height: 5\' 4"  (1.626 m)   Body mass index is 43.26 kg/m.  General appearance:  Normal affect, orientation and appearance. Skin: Grossly normal HEENT: Without gross lesions.  No cervical or supraclavicular adenopathy. Thyroid normal.  Lungs:  Clear without wheezing, rales or rhonchi Cardiac: RR, without RMG Abdominal:  Soft, nontender, without masses, guarding, rebound, organomegaly or hernia Breasts:  Examined lying and sitting without masses, retractions, discharge or axillary adenopathy. Pelvic:  Ext, BUS, Vagina: With atrophic changes.  Second-degree rectocele.  Cervix: With atrophic changes.  Pap smear done  Uterus: Anteverted, normal size, shape and contour, midline and mobile nontender   Adnexa: Without masses or tenderness    Anus and perineum: Normal   Rectovaginal: Normal sphincter tone without palpated masses or tenderness.    Assessment/Plan:  58 y.o. G2P2 female for annual gynecologic exam.   1. Postmenopausal.  No significant menopausal symptoms or any vaginal bleeding. 2. Rectocele, second-degree.  Stable on serial exams.  Asymptomatic to the patient. 3. Pap smear 2019.  Pap smear 2018 showed ASCUS negative high risk HPV.  Pap smear done today.  If normal then plan less frequent screening intervals.  History of low-grade dysplasia in the past. 4. Colonoscopy 2020.  Repeat at their recommended interval. 5. Mammography 06/2019.   Repeat mammogram next year.  Breast exam normal today. 6. DEXA never.  Will plan further into the menopause. 7. Health maintenance.  CBC, CMP and lipid profile ordered.  Follow-up 1 year, sooner as needed.   Anastasio Auerbach MD, 3:53 PM 08/03/2019

## 2019-08-03 NOTE — Addendum Note (Signed)
Addended by: Nelva Nay on: 08/03/2019 04:24 PM   Modules accepted: Orders

## 2019-08-03 NOTE — Patient Instructions (Signed)
Follow-up in 1 year for annual exam, sooner if any issues. 

## 2019-08-04 LAB — CBC WITH DIFFERENTIAL/PLATELET
Absolute Monocytes: 432 cells/uL (ref 200–950)
Basophils Absolute: 60 cells/uL (ref 0–200)
Basophils Relative: 1 %
Eosinophils Absolute: 48 cells/uL (ref 15–500)
Eosinophils Relative: 0.8 %
HCT: 42.7 % (ref 35.0–45.0)
Hemoglobin: 14.2 g/dL (ref 11.7–15.5)
Lymphs Abs: 1566 cells/uL (ref 850–3900)
MCH: 28.1 pg (ref 27.0–33.0)
MCHC: 33.3 g/dL (ref 32.0–36.0)
MCV: 84.4 fL (ref 80.0–100.0)
MPV: 12.4 fL (ref 7.5–12.5)
Monocytes Relative: 7.2 %
Neutro Abs: 3894 cells/uL (ref 1500–7800)
Neutrophils Relative %: 64.9 %
Platelets: 211 10*3/uL (ref 140–400)
RBC: 5.06 10*6/uL (ref 3.80–5.10)
RDW: 14 % (ref 11.0–15.0)
Total Lymphocyte: 26.1 %
WBC: 6 10*3/uL (ref 3.8–10.8)

## 2019-08-04 LAB — LIPID PANEL
Cholesterol: 129 mg/dL (ref <200)
HDL: 40 mg/dL — ABNORMAL LOW (ref >=50)
LDL Cholesterol (Calc): 73 mg/dL (calc)
Non-HDL Cholesterol (Calc): 89 mg/dL (calc) (ref <130)
Total CHOL/HDL Ratio: 3.2 (calc) (ref <5.0)
Triglycerides: 77 mg/dL (ref <150)

## 2019-08-04 LAB — COMPREHENSIVE METABOLIC PANEL
AG Ratio: 1.9 (calc) (ref 1.0–2.5)
ALT: 35 U/L — ABNORMAL HIGH (ref 6–29)
AST: 35 U/L (ref 10–35)
Albumin: 4.5 g/dL (ref 3.6–5.1)
Alkaline phosphatase (APISO): 78 U/L (ref 37–153)
BUN: 9 mg/dL (ref 7–25)
CO2: 24 mmol/L (ref 20–32)
Calcium: 9.7 mg/dL (ref 8.6–10.4)
Chloride: 105 mmol/L (ref 98–110)
Creat: 0.66 mg/dL (ref 0.50–1.05)
Globulin: 2.4 g/dL (calc) (ref 1.9–3.7)
Glucose, Bld: 86 mg/dL (ref 65–99)
Potassium: 4.3 mmol/L (ref 3.5–5.3)
Sodium: 141 mmol/L (ref 135–146)
Total Bilirubin: 1 mg/dL (ref 0.2–1.2)
Total Protein: 6.9 g/dL (ref 6.1–8.1)

## 2019-08-05 LAB — PAP IG W/ RFLX HPV ASCU

## 2019-08-05 LAB — HUMAN PAPILLOMAVIRUS, HIGH RISK: HPV DNA High Risk: NOT DETECTED

## 2019-08-06 ENCOUNTER — Telehealth: Payer: Self-pay

## 2019-08-06 ENCOUNTER — Other Ambulatory Visit: Payer: Self-pay

## 2019-08-06 ENCOUNTER — Encounter: Payer: Self-pay | Admitting: Gynecology

## 2019-08-06 NOTE — Telephone Encounter (Signed)
They should be released anytime now.  Her blood work looks fine.  Her HDL was slightly low which indicates a lack of exercise but the remainder of her lipid profile was fine.  One liver function test was minimally elevated which is very common.  Other liver function tests in the same lab were all normal which tells me the isolated mild elevation really is not significant.  Very frequently when we see these if we repeat them they are normal but I do not think it needs to be repeated in her case.

## 2019-08-06 NOTE — Telephone Encounter (Signed)
-----   Message from Anastasio Auerbach, MD sent at 08/06/2019  8:09 AM EDT ----- Tell patient that her Pap smear again shows some minimal atypia with negative HPV.  I would recommend scheduling a colposcopy appointment.

## 2019-08-06 NOTE — Telephone Encounter (Signed)
Patient informed. She asked when her blood work will be released. Her HDL is low and one of the liver enzymes very slighly elevated. Have you reviewed those?  I did tell her nothing signficant on labs but I thought you needed to review.

## 2019-08-06 NOTE — Telephone Encounter (Signed)
Called patient and per DPR access note on file I left detailed message on cell voice mail. Left my phone number in case any questions.

## 2019-08-10 ENCOUNTER — Encounter: Payer: Self-pay | Admitting: Gynecology

## 2019-08-10 ENCOUNTER — Other Ambulatory Visit: Payer: Self-pay

## 2019-08-10 ENCOUNTER — Ambulatory Visit: Payer: Federal, State, Local not specified - PPO | Admitting: Gynecology

## 2019-08-10 VITALS — BP 118/78

## 2019-08-10 DIAGNOSIS — R87610 Atypical squamous cells of undetermined significance on cytologic smear of cervix (ASC-US): Secondary | ICD-10-CM | POA: Diagnosis not present

## 2019-08-10 NOTE — Patient Instructions (Signed)
Follow-up in 1 year for annual exam and Pap smear

## 2019-08-10 NOTE — Progress Notes (Signed)
    Grace Wells [birth year] [id number]        58 y.o.  G2P2 presents for colposcopy.  History of ASCUS negative high risk HPV 2019 and 2020.  Past medical history,surgical history, problem list, medications, allergies, family history and social history were all reviewed and documented in the EPIC chart.  Directed ROS with pertinent positives and negatives documented in the history of present illness/assessment and plan.  Exam: [name] assistant Vitals:   08/10/19 1604  BP: 118/78   General appearance:  Normal Abdomen soft nontender without masses guarding rebound Pelvic external BUS vagina with atrophic changes.  Cervix with atrophic changes.  Uterus normal size midline mobile nontender.  Adnexa without masses or tenderness.  Colposcopy performed after acetic acid cleanse was adequate with transformation zone visualized 360 degrees.  No abnormalities seen.  No biopsies taken.  Physical Exam  Genitourinary:      Assessment/Plan:  58 y.o. G2P2 with 2 most recent Pap smear showing ASCUS negative high risk HPV.  She does have a history of dysplasia number of years ago.  Colposcopy was adequate and normal.  No biopsies taken.  Recommend follow-up Pap smear in 1 year.  We discussed dysplasia, high-grade/low-grade, progression/regression.  The importance of follow-up Pap smear in 1 year was stressed.    Anastasio Auerbach MD, 4:23 PM 08/10/2019

## 2019-09-15 ENCOUNTER — Encounter: Payer: Self-pay | Admitting: Gynecology

## 2019-11-16 ENCOUNTER — Other Ambulatory Visit: Payer: Self-pay

## 2019-11-16 MED ORDER — SERTRALINE HCL 50 MG PO TABS: 50.0000 mg | ORAL_TABLET | Freq: Every day | ORAL | 3 refills | Status: DC

## 2019-12-17 DIAGNOSIS — M9901 Segmental and somatic dysfunction of cervical region: Secondary | ICD-10-CM | POA: Diagnosis not present

## 2019-12-17 DIAGNOSIS — M542 Cervicalgia: Secondary | ICD-10-CM | POA: Diagnosis not present

## 2019-12-17 DIAGNOSIS — M2669 Other specified disorders of temporomandibular joint: Secondary | ICD-10-CM | POA: Diagnosis not present

## 2019-12-17 DIAGNOSIS — M6240 Contracture of muscle, unspecified site: Secondary | ICD-10-CM | POA: Diagnosis not present

## 2020-03-14 ENCOUNTER — Encounter: Payer: Self-pay | Admitting: Registered Nurse

## 2020-03-14 ENCOUNTER — Other Ambulatory Visit: Payer: Self-pay

## 2020-03-14 ENCOUNTER — Ambulatory Visit (INDEPENDENT_AMBULATORY_CARE_PROVIDER_SITE_OTHER): Payer: Federal, State, Local not specified - PPO | Admitting: Registered Nurse

## 2020-03-14 VITALS — BP 136/78 | HR 68 | Temp 97.8°F | Resp 17 | Ht 64.0 in | Wt 261.8 lb

## 2020-03-14 DIAGNOSIS — Z1159 Encounter for screening for other viral diseases: Secondary | ICD-10-CM

## 2020-03-14 DIAGNOSIS — Z23 Encounter for immunization: Secondary | ICD-10-CM | POA: Diagnosis not present

## 2020-03-14 DIAGNOSIS — R350 Frequency of micturition: Secondary | ICD-10-CM

## 2020-03-14 LAB — POCT URINALYSIS DIP (CLINITEK)
Bilirubin, UA: NEGATIVE
Glucose, UA: NEGATIVE mg/dL
Ketones, POC UA: NEGATIVE mg/dL
Nitrite, UA: NEGATIVE
POC PROTEIN,UA: NEGATIVE
Spec Grav, UA: 1.01 (ref 1.010–1.025)
Urobilinogen, UA: 0.2 E.U./dL
pH, UA: 5.5 (ref 5.0–8.0)

## 2020-03-14 MED ORDER — PHENAZOPYRIDINE HCL 95 MG PO TABS: 95.0000 mg | ORAL_TABLET | Freq: Three times a day (TID) | ORAL | 0 refills | Status: DC | PRN

## 2020-03-14 MED ORDER — SULFAMETHOXAZOLE-TRIMETHOPRIM 800-160 MG PO TABS: 1.0000 | ORAL_TABLET | Freq: Two times a day (BID) | ORAL | 0 refills | Status: DC

## 2020-03-14 NOTE — Patient Instructions (Signed)
° ° ° °  If you have lab work done today you will be contacted with your lab results within the next 2 weeks.  If you have not heard from us then please contact us. The fastest way to get your results is to register for My Chart. ° ° °IF you received an x-ray today, you will receive an invoice from [facility]. Please contact [facility] at [phone] with questions or concerns regarding your invoice.  ° °IF you received labwork today, you will receive an invoice from [facility]. Please contact [facility] at [phone] with questions or concerns regarding your invoice.  ° °Our billing staff will not be able to assist you with questions regarding bills from these companies. ° °You will be contacted with the lab results as soon as they are available. The fastest way to get your results is to activate your My Chart account. Instructions are located on the last page of this paperwork. If you have not heard from us regarding the results in 2 weeks, please contact this office. °  ° ° ° °

## 2020-03-15 LAB — HIV ANTIBODY (ROUTINE TESTING W REFLEX): HIV Screen 4th Generation wRfx: NONREACTIVE

## 2020-03-15 LAB — HEPATITIS C ANTIBODY: Hep C Virus Ab: <0.1 s/co ratio (ref 0.0–0.9)

## 2020-03-21 ENCOUNTER — Encounter: Payer: Self-pay | Admitting: Registered Nurse

## 2020-03-21 ENCOUNTER — Ambulatory Visit: Payer: Federal, State, Local not specified - PPO | Admitting: Registered Nurse

## 2020-03-21 ENCOUNTER — Other Ambulatory Visit: Payer: Self-pay

## 2020-03-21 VITALS — BP 138/90 | HR 64 | Temp 97.3°F | Resp 16 | Ht 64.0 in | Wt 264.8 lb

## 2020-03-21 DIAGNOSIS — R350 Frequency of micturition: Secondary | ICD-10-CM

## 2020-03-21 DIAGNOSIS — Z7689 Persons encountering health services in other specified circumstances: Secondary | ICD-10-CM | POA: Diagnosis not present

## 2020-03-21 NOTE — Patient Instructions (Signed)
° ° ° °  If you have lab work done today you will be contacted with your lab results within the next 2 weeks.  If you have not heard from us then please contact us. The fastest way to get your results is to register for My Chart. ° ° °IF you received an x-ray today, you will receive an invoice from [facility]. Please contact [facility] at [phone] with questions or concerns regarding your invoice.  ° °IF you received labwork today, you will receive an invoice from [facility]. Please contact [facility] at [phone] with questions or concerns regarding your invoice.  ° °Our billing staff will not be able to assist you with questions regarding bills from these companies. ° °You will be contacted with the lab results as soon as they are available. The fastest way to get your results is to activate your My Chart account. Instructions are located on the last page of this paperwork. If you have not heard from us regarding the results in 2 weeks, please contact this office. °  ° ° ° °

## 2020-03-27 ENCOUNTER — Encounter: Payer: Self-pay | Admitting: Registered Nurse

## 2020-03-27 NOTE — Progress Notes (Signed)
Acute Office Visit  Subjective:    Patient ID: Grace Wells, female    DOB: 1961/08/14, 59 y.o.   MRN: [id number]  Chief Complaint  Patient presents with  . Abdominal Cramping    patient states since yesterday she has had some stomach/abdominal pain and urine frequency thats starting to get too uncomfortable    HPI Patient is in today for possible UTI  Since yesterday, suprapubic and flank pain, dysuria, cloudy urine. No fever,c hills, fatigue Has had uti in remote past Feels the same.  No further concerns. Will return to establish care.   Past Medical History:  Diagnosis Date  . Anxiety   . ASCUS of cervix with negative high risk HPV 03/2017, 07/2019  . Cervical dysplasia    LGSIL C&B 6/08, LGSIL pap12/09 & 6/10, WNL after  . Diverticulitis   . Fatty liver 07/2012   mild elevated LFTs  . GERD (gastroesophageal reflux disease)   . Obesity   . Psoriasis   . Rectocele 2014    Past Surgical History:  Procedure Laterality Date  . COLONOSCOPY    . COLPOSCOPY    . ROOT CANAL    . UPPER GASTROINTESTINAL ENDOSCOPY      Family History  Problem Relation Age of Onset  . Diabetes Maternal Grandmother   . Emphysema Maternal Grandmother   . Colon cancer Paternal Grandmother   . Heart disease Paternal Grandfather        stroke  . Stroke Paternal Grandfather   . Cancer Mother        Lymphoma  . Lung cancer Father        smoker  . Hypertension Brother   . Cancer Maternal Grandfather        Hodgkins  . Esophageal cancer Neg Hx   . Rectal cancer Neg Hx   . Stomach cancer Neg Hx     Social History   Socioeconomic History  . Marital status: Widowed    Spouse name: Not on file  . Number of children: Not on file  . Years of education: Not on file  . Highest education level: Not on file  Occupational History  . Not on file  Tobacco Use  . Smoking status: Never Smoker  . Smokeless tobacco: Never Used  Substance and Sexual Activity  . Alcohol use: No     Alcohol/week: 0.0 standard drinks  . Drug use: No  . Sexual activity: Not Currently    Comment: 1st intercourse 59 yo-Fewer than 5 partners  Other Topics Concern  . Not on file  Social History Narrative  . Not on file   Social Determinants of Health   Financial Resource Strain:   . Difficulty of Paying Living Expenses:   Food Insecurity:   . Worried About Charity fundraiser in the Last Year:   . Arboriculturist in the Last Year:   Transportation Needs:   . Film/video editor (Medical):   Marland Kitchen Lack of Transportation (Non-Medical):   Physical Activity:   . Days of Exercise per Week:   . Minutes of Exercise per Session:   Stress:   . Feeling of Stress :   Social Connections:   . Frequency of Communication with Friends and Family:   . Frequency of Social Gatherings with Friends and Family:   . Attends Religious Services:   . Active Member of Clubs or Organizations:   . Attends Archivist Meetings:   Marland Kitchen Marital Status:   Intimate  Partner Violence:   . Fear of Current or Ex-Partner:   . Emotionally Abused:   Marland Kitchen Physically Abused:   . Sexually Abused:     Outpatient Medications Prior to Visit  Medication Sig Dispense Refill  . AMBULATORY NON FORMULARY MEDICATION Medication Name: Bee Caps 2 tablets by mouth daily    . Multiple Vitamin (MULTIVITAMIN) tablet Take 1 tablet by mouth daily.    . Multiple Vitamins-Minerals (HAIR/SKIN/NAILS/BIOTIN PO) Take 1 capsule by mouth daily.    Marland Kitchen omeprazole (PRILOSEC) 40 MG capsule Take 1 capsule (40 mg total) by mouth every morning. Take 30 minutes before breakfast 30 capsule 8  . sertraline (ZOLOFT) 50 MG tablet Take 1 tablet (50 mg total) by mouth daily. 90 tablet 3   No facility-administered medications prior to visit.    Allergies  Allergen Reactions  . Codeine Nausea And Vomiting  . Penicillins     Fingers turned blue, red splotches, started swelling    Review of Systems  Constitutional: Negative.   HENT: Negative.     Eyes: Negative.   Respiratory: Negative.   Cardiovascular: Negative.   Gastrointestinal: Negative.   Endocrine: Negative.   Genitourinary: Positive for dysuria and flank pain. Negative for hematuria.  Skin: Negative.   Allergic/Immunologic: Negative.   Neurological: Negative.   Hematological: Negative.   Psychiatric/Behavioral: Negative.   All other systems reviewed and are negative.      Objective:    Physical Exam Vitals and nursing note reviewed.  Constitutional:      General: She is not in acute distress.    Appearance: Normal appearance. She is normal weight. She is not ill-appearing, toxic-appearing or diaphoretic.  Cardiovascular:     Rate and Rhythm: Normal rate and regular rhythm.  Pulmonary:     Effort: Pulmonary effort is normal. No respiratory distress.  Neurological:     General: No focal deficit present.     Mental Status: She is alert. Mental status is at baseline.  Psychiatric:        Mood and Affect: Mood normal.        Behavior: Behavior normal.        Thought Content: Thought content normal.        Judgment: Judgment normal.     BP 136/78   Pulse 68   Temp 97.8 F (36.6 C) (Temporal)   Resp 17   Ht 5\' 4"  (1.626 m)   Wt 261 lb 12.8 oz (118.8 kg)   SpO2 96%   BMI 44.94 kg/m  Wt Readings from Last 3 Encounters:  03/21/20 264 lb 12.8 oz (120.1 kg)  03/14/20 261 lb 12.8 oz (118.8 kg)  08/03/19 252 lb (114.3 kg)    There are no preventive care reminders to display for this patient.  There are no preventive care reminders to display for this patient.   Lab Results  Component Value Date   TSH 2.83 05/30/2018   Lab Results  Component Value Date   WBC 6.0 08/03/2019   HGB 14.2 08/03/2019   HCT 42.7 08/03/2019   MCV 84.4 08/03/2019   PLT 211 08/03/2019   Lab Results  Component Value Date   NA 141 08/03/2019   K 4.3 08/03/2019   CO2 24 08/03/2019   GLUCOSE 86 08/03/2019   BUN 9 08/03/2019   CREATININE 0.66 08/03/2019   BILITOT 1.0  08/03/2019   ALKPHOS 74 04/03/2017   AST 35 08/03/2019   ALT 35 (H) 08/03/2019   PROT 6.9 08/03/2019   ALBUMIN  4.1 04/03/2017   CALCIUM 9.7 08/03/2019   Lab Results  Component Value Date   CHOL 129 08/03/2019   Lab Results  Component Value Date   HDL 40 (L) 08/03/2019   Lab Results  Component Value Date   LDLCALC 73 08/03/2019   Lab Results  Component Value Date   TRIG 77 08/03/2019   Lab Results  Component Value Date   CHOLHDL 3.2 08/03/2019   No results found for: HGBA1C     Assessment & Plan:   Problem List Items Addressed This Visit    None    Visit Diagnoses    Urine frequency    -  Primary   Relevant Medications   sulfamethoxazole-trimethoprim (BACTRIM DS) 800-160 MG tablet   phenazopyridine (PYRIDIUM) 95 MG tablet   Other Relevant Orders   POCT URINALYSIS DIP (CLINITEK) (Completed)   Screening for viral disease       Relevant Orders   Hepatitis C antibody (Completed)   HIV antibody (with reflex) (Completed)   Need for diphtheria-tetanus-pertussis (Tdap) vaccine       Relevant Orders   Tdap vaccine greater than or equal to 7yo IM (Completed)       Meds ordered this encounter  Medications  . sulfamethoxazole-trimethoprim (BACTRIM DS) 800-160 MG tablet    Sig: Take 1 tablet by mouth 2 (two) times daily.    Dispense:  6 tablet    Refill:  0    Order Specific Question:   Supervising Provider    Answer:   Delia Chimes A [id number]  . phenazopyridine (PYRIDIUM) 95 MG tablet    Sig: Take 1 tablet (95 mg total) by mouth 3 (three) times daily as needed for pain.    Dispense:  10 tablet    Refill:  0    Order Specific Question:   Supervising Provider    Answer:   Forrest Moron [id number]   PLAN  POCT UA shows likely UTI  Will send for culture  Will tx with bactrim po bid for three days  Pyridium PO tid prn for pain relief  Return if symptoms worsen or fail to improve  Patient encouraged to call clinic with any questions, comments, or  concerns.    Maximiano Coss, NP

## 2020-04-03 ENCOUNTER — Encounter: Payer: Self-pay | Admitting: Registered Nurse

## 2020-04-03 NOTE — Progress Notes (Signed)
Established Patient Office Visit  Subjective:  Patient ID: Grace Wells, female    DOB: 1961-02-02  Age: 59 y.o. MRN: [id number]  CC:  Chief Complaint  Patient presents with  . Transitions Of Care    transition of care no other concerns at ths time.    HPI BRITHANY MAHER presents for UTI follow up and est care  UTI symptoms resolved. Feeling much improved at this time. No concerns for ongoing symptoms - declines urinary symptoms, nvd, chills, fever  Otherwise, has some significant medical history to review, but none give her any urgent concerns today. Est with GYN for hx of abn pap. Up to date on preventative screening  Labs had been collected and reviewed at last visit.   Past Medical History:  Diagnosis Date  . Anxiety   . ASCUS of cervix with negative high risk HPV 03/2017, 07/2019  . Cervical dysplasia    LGSIL C&B 6/08, LGSIL pap12/09 & 6/10, WNL after  . Diverticulitis   . Fatty liver 07/2012   mild elevated LFTs  . GERD (gastroesophageal reflux disease)   . Obesity   . Psoriasis   . Rectocele 2014    Past Surgical History:  Procedure Laterality Date  . COLONOSCOPY    . COLPOSCOPY    . ROOT CANAL    . UPPER GASTROINTESTINAL ENDOSCOPY      Family History  Problem Relation Age of Onset  . Diabetes Maternal Grandmother   . Emphysema Maternal Grandmother   . Colon cancer Paternal Grandmother   . Heart disease Paternal Grandfather        stroke  . Stroke Paternal Grandfather   . Cancer Mother        Lymphoma  . Lung cancer Father        smoker  . Hypertension Brother   . Cancer Maternal Grandfather        Hodgkins  . Esophageal cancer Neg Hx   . Rectal cancer Neg Hx   . Stomach cancer Neg Hx     Social History   Socioeconomic History  . Marital status: Widowed    Spouse name: Not on file  . Number of children: Not on file  . Years of education: Not on file  . Highest education level: Not on file  Occupational History  . Not on file   Tobacco Use  . Smoking status: Never Smoker  . Smokeless tobacco: Never Used  Substance and Sexual Activity  . Alcohol use: No    Alcohol/week: 0.0 standard drinks  . Drug use: No  . Sexual activity: Not Currently    Comment: 1st intercourse 59 yo-Fewer than 5 partners  Other Topics Concern  . Not on file  Social History Narrative  . Not on file   Social Determinants of Health   Financial Resource Strain:   . Difficulty of Paying Living Expenses:   Food Insecurity:   . Worried About Charity fundraiser in the Last Year:   . Arboriculturist in the Last Year:   Transportation Needs:   . Film/video editor (Medical):   Marland Kitchen Lack of Transportation (Non-Medical):   Physical Activity:   . Days of Exercise per Week:   . Minutes of Exercise per Session:   Stress:   . Feeling of Stress :   Social Connections:   . Frequency of Communication with Friends and Family:   . Frequency of Social Gatherings with Friends and Family:   . Attends  Religious Services:   . Active Member of Clubs or Organizations:   . Attends Archivist Meetings:   Marland Kitchen Marital Status:   Intimate Partner Violence:   . Fear of Current or Ex-Partner:   . Emotionally Abused:   Marland Kitchen Physically Abused:   . Sexually Abused:     Outpatient Medications Prior to Visit  Medication Sig Dispense Refill  . AMBULATORY NON FORMULARY MEDICATION Medication Name: Bee Caps 2 tablets by mouth daily    . Multiple Vitamin (MULTIVITAMIN) tablet Take 1 tablet by mouth daily.    . Multiple Vitamins-Minerals (HAIR/SKIN/NAILS/BIOTIN PO) Take 1 capsule by mouth daily.    Marland Kitchen omeprazole (PRILOSEC) 40 MG capsule Take 1 capsule (40 mg total) by mouth every morning. Take 30 minutes before breakfast 30 capsule 8  . phenazopyridine (PYRIDIUM) 95 MG tablet Take 1 tablet (95 mg total) by mouth 3 (three) times daily as needed for pain. 10 tablet 0  . sertraline (ZOLOFT) 50 MG tablet Take 1 tablet (50 mg total) by mouth daily. 90 tablet 3   . sulfamethoxazole-trimethoprim (BACTRIM DS) 800-160 MG tablet Take 1 tablet by mouth 2 (two) times daily. 6 tablet 0   No facility-administered medications prior to visit.    Allergies  Allergen Reactions  . Codeine Nausea And Vomiting  . Penicillins     Fingers turned blue, red splotches, started swelling    ROS Review of Systems  Constitutional: Negative.   HENT: Negative.   Eyes: Negative.   Respiratory: Negative.   Cardiovascular: Negative.   Gastrointestinal: Negative.   Endocrine: Negative.   Genitourinary: Negative.   Musculoskeletal: Negative.   Skin: Negative.   Allergic/Immunologic: Negative.   Neurological: Negative.   Hematological: Negative.   Psychiatric/Behavioral: Negative.   All other systems reviewed and are negative.     Objective:    Physical Exam  Constitutional: She is oriented to person, place, and time. She appears well-developed and well-nourished. No distress.  Cardiovascular: Normal rate and regular rhythm.  Pulmonary/Chest: Effort normal. No respiratory distress.  Neurological: She is alert and oriented to person, place, and time.  Skin: Skin is warm and dry. No rash noted. She is not diaphoretic. No erythema. No pallor.  Psychiatric: She has a normal mood and affect. Her behavior is normal. Judgment and thought content normal.  Nursing note and vitals reviewed.   BP 138/90   Pulse 64   Temp (!) 97.3 F (36.3 C) (Temporal)   Resp 16   Ht 5\' 4"  (1.626 m)   Wt 264 lb 12.8 oz (120.1 kg)   SpO2 98%   BMI 45.45 kg/m  Wt Readings from Last 3 Encounters:  03/21/20 264 lb 12.8 oz (120.1 kg)  03/14/20 261 lb 12.8 oz (118.8 kg)  08/03/19 252 lb (114.3 kg)     There are no preventive care reminders to display for this patient.  There are no preventive care reminders to display for this patient.  Lab Results  Component Value Date   TSH 2.83 05/30/2018   Lab Results  Component Value Date   WBC 6.0 08/03/2019   HGB 14.2  08/03/2019   HCT 42.7 08/03/2019   MCV 84.4 08/03/2019   PLT 211 08/03/2019   Lab Results  Component Value Date   NA 141 08/03/2019   K 4.3 08/03/2019   CO2 24 08/03/2019   GLUCOSE 86 08/03/2019   BUN 9 08/03/2019   CREATININE 0.66 08/03/2019   BILITOT 1.0 08/03/2019   ALKPHOS 74 04/03/2017  AST 35 08/03/2019   ALT 35 (H) 08/03/2019   PROT 6.9 08/03/2019   ALBUMIN 4.1 04/03/2017   CALCIUM 9.7 08/03/2019   Lab Results  Component Value Date   CHOL 129 08/03/2019   Lab Results  Component Value Date   HDL 40 (L) 08/03/2019   Lab Results  Component Value Date   LDLCALC 73 08/03/2019   Lab Results  Component Value Date   TRIG 77 08/03/2019   Lab Results  Component Value Date   CHOLHDL 3.2 08/03/2019   No results found for: HGBA1C    Assessment & Plan:   Problem List Items Addressed This Visit    None    Visit Diagnoses    Urine frequency    -  Primary   Encounter to establish care          No orders of the defined types were placed in this encounter.   Follow-up: No follow-ups on file.   PLAN  Reviewed and updated medical history  Reviewed labs  Pt to return for CPE at her convenience  Patient encouraged to call clinic with any questions, comments, or concerns.  Maximiano Coss, NP

## 2020-05-25 DIAGNOSIS — M6240 Contracture of muscle, unspecified site: Secondary | ICD-10-CM | POA: Diagnosis not present

## 2020-05-25 DIAGNOSIS — M542 Cervicalgia: Secondary | ICD-10-CM | POA: Diagnosis not present

## 2020-05-25 DIAGNOSIS — M9901 Segmental and somatic dysfunction of cervical region: Secondary | ICD-10-CM | POA: Diagnosis not present

## 2020-05-25 DIAGNOSIS — M2669 Other specified disorders of temporomandibular joint: Secondary | ICD-10-CM | POA: Diagnosis not present

## 2020-05-26 DIAGNOSIS — M542 Cervicalgia: Secondary | ICD-10-CM | POA: Diagnosis not present

## 2020-05-26 DIAGNOSIS — M2669 Other specified disorders of temporomandibular joint: Secondary | ICD-10-CM | POA: Diagnosis not present

## 2020-05-26 DIAGNOSIS — M6240 Contracture of muscle, unspecified site: Secondary | ICD-10-CM | POA: Diagnosis not present

## 2020-05-26 DIAGNOSIS — M9901 Segmental and somatic dysfunction of cervical region: Secondary | ICD-10-CM | POA: Diagnosis not present

## 2020-07-28 DIAGNOSIS — M2669 Other specified disorders of temporomandibular joint: Secondary | ICD-10-CM | POA: Diagnosis not present

## 2020-07-28 DIAGNOSIS — M542 Cervicalgia: Secondary | ICD-10-CM | POA: Diagnosis not present

## 2020-07-28 DIAGNOSIS — M9901 Segmental and somatic dysfunction of cervical region: Secondary | ICD-10-CM | POA: Diagnosis not present

## 2020-07-28 DIAGNOSIS — M6240 Contracture of muscle, unspecified site: Secondary | ICD-10-CM | POA: Diagnosis not present

## 2020-09-03 ENCOUNTER — Encounter: Payer: Self-pay | Admitting: Obstetrics and Gynecology

## 2020-09-03 DIAGNOSIS — Z1231 Encounter for screening mammogram for malignant neoplasm of breast: Secondary | ICD-10-CM | POA: Diagnosis not present

## 2020-11-07 DIAGNOSIS — M2669 Other specified disorders of temporomandibular joint: Secondary | ICD-10-CM | POA: Diagnosis not present

## 2020-11-07 DIAGNOSIS — M9901 Segmental and somatic dysfunction of cervical region: Secondary | ICD-10-CM | POA: Diagnosis not present

## 2020-11-07 DIAGNOSIS — M542 Cervicalgia: Secondary | ICD-10-CM | POA: Diagnosis not present

## 2020-11-07 DIAGNOSIS — M6240 Contracture of muscle, unspecified site: Secondary | ICD-10-CM | POA: Diagnosis not present

## 2020-12-21 ENCOUNTER — Encounter: Payer: Self-pay | Admitting: Emergency Medicine

## 2020-12-21 ENCOUNTER — Other Ambulatory Visit: Payer: Self-pay

## 2020-12-21 ENCOUNTER — Telehealth (INDEPENDENT_AMBULATORY_CARE_PROVIDER_SITE_OTHER): Payer: Federal, State, Local not specified - PPO | Admitting: Emergency Medicine

## 2020-12-21 DIAGNOSIS — R0981 Nasal congestion: Secondary | ICD-10-CM

## 2020-12-21 DIAGNOSIS — J0100 Acute maxillary sinusitis, unspecified: Secondary | ICD-10-CM

## 2020-12-21 MED ORDER — AZITHROMYCIN 250 MG PO TABS: ORAL_TABLET | ORAL | 0 refills | Status: DC

## 2020-12-21 MED ORDER — PSEUDOEPHEDRINE-GUAIFENESIN ER 60-600 MG PO TB12: 1.0000 | ORAL_TABLET | Freq: Two times a day (BID) | ORAL | 1 refills | Status: AC

## 2020-12-21 NOTE — Progress Notes (Signed)
Telemedicine Encounter- SOAP NOTE Established Patient Patient: Home  Provider: Office     This telephone encounter was conducted with the patient's (or proxy's) verbal consent via audio telecommunications: yes/no: Yes Patient was instructed to have this encounter in a suitably private space; and to only have persons present to whom they give permission to participate. In addition, patient identity was confirmed by use of name plus two identifiers (DOB and address).  I discussed the limitations, risks, security and privacy concerns of performing an evaluation and management service by telephone and the availability of in person appointments. I also discussed with the patient that there may be a patient responsible charge related to this service. The patient expressed understanding and agreed to proceed.  I spent a total of TIME; 0 MIN TO 60 MIN: 20 minutes talking with the patient or their proxy.  Chief Complaint  Patient presents with  . Cough    nonproductive  . Nasal Congestion    Worse - Per patient it started Saturday with soreness of both ears. Eyes watery and itching, teeth hurt. Patient had Covid 19 07/30/2020 and not vaccinated.    Subjective   Grace Wells is a 60 y.o. female established patient. Telephone visit today complaining of sinus congestion with stuffy head, itchy eyes and head congestion that started 4 days ago.  Also had nonproductive cough and soreness to both ears which is better today.  Persistent symptoms not getting better.  No other significant symptoms. Had Covid infection last summer.  No vaccination. No other complaints or medical concerns today.  HPI   Patient Active Problem List   Diagnosis Date Noted  . OSA on CPAP 07/10/2018  . Rectocele   . DIVERTICULOSIS, COLON 08/12/2009  . FATTY LIVER DISEASE 08/12/2009  . OBESITY 07/18/2009  . HEMORRHOIDS 07/15/2009  . GERD 07/15/2009  . HIATAL HERNIA 07/15/2009  . IRRITABLE BOWEL SYNDROME  07/15/2009  . ABDOMINAL PAIN, LOWER 07/15/2009  . TRANSAMINASES, SERUM, ELEVATED 07/15/2009  . NEPHROLITHIASIS, HX OF 07/15/2009    Past Medical History:  Diagnosis Date  . Anxiety   . ASCUS of cervix with negative high risk HPV 03/2017, 07/2019  . Cervical dysplasia    LGSIL C&B 6/08, LGSIL pap12/09 & 6/10, WNL after  . Diverticulitis   . Fatty liver 07/2012   mild elevated LFTs  . GERD (gastroesophageal reflux disease)   . Obesity   . Psoriasis   . Rectocele 2014    Current Outpatient Medications  Medication Sig Dispense Refill  . AMBULATORY NON FORMULARY MEDICATION Medication Name: Bee Caps 2 tablets by mouth daily    . Ascorbic Acid (VITAMIN C PO) Take by mouth daily.    . Cholecalciferol (VITAMIN D3) 50 MCG (2000 UT) TABS Take by mouth daily.    Marland Kitchen Dextromethorphan-guaiFENesin (MUCINEX DM PO) Take by mouth daily.    . Multiple Vitamin (MULTIVITAMIN) tablet Take 1 tablet by mouth daily.    . Multiple Vitamins-Minerals (HAIR/SKIN/NAILS/BIOTIN PO) Take 1 capsule by mouth daily.    . Multiple Vitamins-Minerals (ZINC PO) Take 100 mg by mouth daily.    Marland Kitchen omeprazole (PRILOSEC) 40 MG capsule Take 1 capsule (40 mg total) by mouth every morning. Take 30 minutes before breakfast 30 capsule 8  . phenazopyridine (PYRIDIUM) 95 MG tablet Take 1 tablet (95 mg total) by mouth 3 (three) times daily as needed for pain. 10 tablet 0  . sertraline (ZOLOFT) 50 MG tablet Take 1 tablet (50 mg total) by mouth daily. 90  tablet 3  . sulfamethoxazole-trimethoprim (BACTRIM DS) 800-160 MG tablet Take 1 tablet by mouth 2 (two) times daily. 6 tablet 0  . vitamin B-12 (CYANOCOBALAMIN) 1000 MCG tablet Take 1,000 mcg by mouth daily.     No current facility-administered medications for this visit.    Allergies  Allergen Reactions  . Codeine Nausea And Vomiting  . Penicillins     Fingers turned blue, red splotches, started swelling    Social History   Socioeconomic History  . Marital status: Widowed     Spouse name: Not on file  . Number of children: Not on file  . Years of education: Not on file  . Highest education level: Not on file  Occupational History  . Not on file  Tobacco Use  . Smoking status: Never Smoker  . Smokeless tobacco: Never Used  Vaping Use  . Vaping Use: Never used  Substance and Sexual Activity  . Alcohol use: No    Alcohol/week: 0.0 standard drinks  . Drug use: No  . Sexual activity: Not Currently    Comment: 1st intercourse 60 yo-Fewer than 5 partners  Other Topics Concern  . Not on file  Social History Narrative  . Not on file   Social Determinants of Health   Financial Resource Strain: Not on file  Food Insecurity: Not on file  Transportation Needs: Not on file  Physical Activity: Not on file  Stress: Not on file  Social Connections: Not on file  Intimate Partner Violence: Not on file    Review of Systems  Constitutional: Negative for chills and fever.  HENT: Positive for congestion and sinus pain. Negative for sore throat.   Respiratory: Positive for cough. Negative for shortness of breath.   Cardiovascular: Negative.  Negative for chest pain and palpitations.  Gastrointestinal: Negative.  Negative for abdominal pain, diarrhea, nausea and vomiting.  Genitourinary: Negative.   Musculoskeletal: Negative.  Negative for back pain, joint pain, myalgias and neck pain.  Skin: Negative for rash.  Neurological: Positive for headaches. Negative for dizziness.  All other systems reviewed and are negative.   Objective  Alert and oriented x3 in no apparent respiratory distress. Vitals as reported by the patient: There were no vitals filed for this visit.  There are no diagnoses linked to this encounter. [name] was seen today for cough and nasal congestion.  Diagnoses and all orders for this visit:  Sinus congestion -     pseudoephedrine-guaifenesin (MUCINEX D) 60-600 MG 12 hr tablet; Take 1 tablet by mouth every 12 (twelve) hours for 5  days.  Acute non-recurrent maxillary sinusitis -     azithromycin (ZITHROMAX) 250 MG tablet; Sig as indicated  Clinically stable.  No red flag signs or symptoms. Medications as prescribed. Advised to call the office if no better or worse during the next several days.   I discussed the assessment and treatment plan with the patient. The patient was provided an opportunity to ask questions and all were answered. The patient agreed with the plan and demonstrated an understanding of the instructions.   The patient was advised to call back or seek an in-person evaluation if the symptoms worsen or if the condition fails to improve as anticipated.  I provided 20 minutes of non-face-to-face time during this encounter.  Georgina Quint, MD  Primary [facility] at [facility]

## 2020-12-29 ENCOUNTER — Other Ambulatory Visit: Payer: Self-pay

## 2021-01-17 ENCOUNTER — Encounter: Payer: Self-pay | Admitting: Obstetrics & Gynecology

## 2021-01-17 ENCOUNTER — Other Ambulatory Visit: Payer: Self-pay

## 2021-01-17 ENCOUNTER — Ambulatory Visit: Payer: Federal, State, Local not specified - PPO | Admitting: Obstetrics & Gynecology

## 2021-01-17 VITALS — BP 122/84 | HR 90 | Resp 14 | Ht 65.0 in | Wt 236.0 lb

## 2021-01-17 DIAGNOSIS — E6609 Other obesity due to excess calories: Secondary | ICD-10-CM

## 2021-01-17 DIAGNOSIS — Z8616 Personal history of COVID-19: Secondary | ICD-10-CM

## 2021-01-17 DIAGNOSIS — N816 Rectocele: Secondary | ICD-10-CM

## 2021-01-17 DIAGNOSIS — Z1272 Encounter for screening for malignant neoplasm of vagina: Secondary | ICD-10-CM

## 2021-01-17 DIAGNOSIS — R87610 Atypical squamous cells of undetermined significance on cytologic smear of cervix (ASC-US): Secondary | ICD-10-CM

## 2021-01-17 DIAGNOSIS — Z01419 Encounter for gynecological examination (general) (routine) without abnormal findings: Secondary | ICD-10-CM

## 2021-01-17 DIAGNOSIS — Z780 Asymptomatic menopausal state: Secondary | ICD-10-CM

## 2021-01-17 DIAGNOSIS — Z6839 Body mass index (BMI) 39.0-39.9, adult: Secondary | ICD-10-CM

## 2021-01-17 NOTE — Progress Notes (Signed)
Grace Wells November 24, 1961 [id number]   History:    60 y.o. G2P2L2 Widowed x 8 years.  Son lives with her.  RP:  Established patient presenting for annual gyn exam   HPI: Postmenopausal.  No significant menopausal symptoms or any vaginal bleeding. No pelvic pain.  Rectocele, second-degree.  Stable on serial exams.  Asymptomatic to the patient.  ASCUS/HPV HR Neg 07/2019.  Colposcopy No Dysplasia 07/2019.  Breasts normal.  Mammography 06/2020 Negative. Colonoscopy 2020.  Health labs here today.  BMI 39.27.  Needs to improve fitness and start a lower Calorie/Carb diet.  Past medical history,surgical history, family history and social history were all reviewed and documented in the EPIC chart.  Gynecologic History No LMP recorded. Patient is postmenopausal.  Obstetric History OB History  Gravida Para Term Preterm AB Living  _0 SAB IAB Ectopic Multiple Live Births               # Outcome Date GA Lbr Len/2nd Weight Sex Delivery Anes PTL Lv  2 Para           1 Para              ROS: A ROS was performed and pertinent positives and negatives are included in the history.  GENERAL: No fevers or chills. HEENT: No change in vision, no earache, sore throat or sinus congestion. NECK: No pain or stiffness. CARDIOVASCULAR: No chest pain or pressure. No palpitations. PULMONARY: No shortness of breath, cough or wheeze. GASTROINTESTINAL: No abdominal pain, nausea, vomiting or diarrhea, melena or bright red blood per rectum. GENITOURINARY: No urinary frequency, urgency, hesitancy or dysuria. MUSCULOSKELETAL: No joint or muscle pain, no back pain, no recent trauma. DERMATOLOGIC: No rash, no itching, no lesions. ENDOCRINE: No polyuria, polydipsia, no heat or cold intolerance. No recent change in weight. HEMATOLOGICAL: No anemia or easy bruising or bleeding. NEUROLOGIC: No headache, seizures, numbness, tingling or weakness. PSYCHIATRIC: No depression, no loss of interest in normal activity or change  in sleep pattern.     Exam:   BP 122/84 (BP Location: Right Arm, Patient Position: Sitting, Cuff Size: Normal)   Pulse 90   Resp 14   Ht _1  (1.651 m)   Wt 236 lb (107 kg)   BMI 39.27 kg/m   Body mass index is 39.27 kg/m.  General appearance : Well developed well nourished female. No acute distress HEENT: Eyes: no retinal hemorrhage or exudates,  Neck supple, trachea midline, no carotid bruits, no thyroidmegaly Lungs: Clear to auscultation, no rhonchi or wheezes, or rib retractions  Heart: Regular rate and rhythm, no murmurs or gallops Breast:Examined in sitting and supine position were symmetrical in appearance, no palpable masses or tenderness,  no skin retraction, no nipple inversion, no nipple discharge, no skin discoloration, no axillary or supraclavicular lymphadenopathy Abdomen: no palpable masses or tenderness, no rebound or guarding Extremities: no edema or skin discoloration or tenderness  Pelvic: Vulva: Normal             Vagina: No gross lesions or discharge  Cervix: No gross lesions or discharge.  Pap reflex done.  Uterus  AV, normal size, shape and consistency, non-tender and mobile  Adnexa  Without masses or tenderness  Anus: Normal   Assessment/Plan:  60 y.o. female for annual exam   1. Encounter for routine gynecological examination with Papanicolaou smear of cervix Normal gynecologic exam in menopause.  Pap reflex done.  Breasts normal.  Screening mammo neg 06/2020.  Colono 2020.  Health labs here today. - CBC - Comp Met (CMET) - TSH - Lipid Profile - Vitamin D 1,25 dihydroxy  2. ASCUS of cervix with negative high risk HPV Pap reflex done today.  3. Postmenopause Well on no HRT.  No PMB.  Vit D supplements/Ca++ 1500 mg daily total, regular weight bearing physical activities.  4. Rectocele Grade 2/3 asymptomatic.  Avoid pelvic floor pressure.  5. Class 2 obesity due to excess calories without serious comorbidity with body mass index (BMI) of 39.0  to 39.9 in adult Recommend a lower calorie/carb diet.  Aerobic activities 5 times a week and light weightlifting every 2 days.  6. History of COVID-19 - SAR CoV2 Serology (COVID 19)AB(IGG)IA  Princess Bruins MD, 11:20 AM 01/17/2021

## 2021-01-18 ENCOUNTER — Encounter: Payer: Self-pay | Admitting: Obstetrics & Gynecology

## 2021-01-20 LAB — CBC
HCT: 43.9 % (ref 35.0–45.0)
Hemoglobin: 14.6 g/dL (ref 11.7–15.5)
MCH: 28.5 pg (ref 27.0–33.0)
MCHC: 33.3 g/dL (ref 32.0–36.0)
MCV: 85.6 fL (ref 80.0–100.0)
MPV: 13.1 fL — ABNORMAL HIGH (ref 7.5–12.5)
Platelets: 150 10*3/uL (ref 140–400)
RBC: 5.13 10*6/uL — ABNORMAL HIGH (ref 3.80–5.10)
RDW: 13.5 % (ref 11.0–15.0)
WBC: 4.1 10*3/uL (ref 3.8–10.8)

## 2021-01-20 LAB — PAP IG W/ RFLX HPV ASCU

## 2021-01-20 LAB — COMPREHENSIVE METABOLIC PANEL
AG Ratio: 1.4 (calc) (ref 1.0–2.5)
ALT: 27 U/L (ref 6–29)
AST: 41 U/L — ABNORMAL HIGH (ref 10–35)
Albumin: 4.2 g/dL (ref 3.6–5.1)
Alkaline phosphatase (APISO): 93 U/L (ref 37–153)
BUN: 11 mg/dL (ref 7–25)
CO2: 20 mmol/L (ref 20–32)
Calcium: 9.8 mg/dL (ref 8.6–10.4)
Chloride: 108 mmol/L (ref 98–110)
Creat: 0.86 mg/dL (ref 0.50–1.05)
Globulin: 3.1 g/dL (calc) (ref 1.9–3.7)
Glucose, Bld: 95 mg/dL (ref 65–99)
Potassium: 5.1 mmol/L (ref 3.5–5.3)
Sodium: 141 mmol/L (ref 135–146)
Total Bilirubin: 0.6 mg/dL (ref 0.2–1.2)
Total Protein: 7.3 g/dL (ref 6.1–8.1)

## 2021-01-20 LAB — LIPID PANEL
Cholesterol: 135 mg/dL (ref <200)
HDL: 41 mg/dL — ABNORMAL LOW (ref >=50)
LDL Cholesterol (Calc): 79 mg/dL (calc)
Non-HDL Cholesterol (Calc): 94 mg/dL (calc) (ref <130)
Total CHOL/HDL Ratio: 3.3 (calc) (ref <5.0)
Triglycerides: 71 mg/dL (ref <150)

## 2021-01-20 LAB — VITAMIN D 1,25 DIHYDROXY
Vitamin D 1, 25 (OH)2 Total: 72 pg/mL (ref 18–72)
Vitamin D2 1, 25 (OH)2: <8 pg/mL
Vitamin D3 1, 25 (OH)2: 72 pg/mL

## 2021-01-20 LAB — HUMAN PAPILLOMAVIRUS, HIGH RISK: HPV DNA High Risk: NOT DETECTED

## 2021-01-20 LAB — TSH: TSH: 2.07 mIU/L (ref 0.40–4.50)

## 2021-01-20 LAB — SARS-COV-2 ANTIBODY(IGG)SPIKE,SEMI-QUANTITATIVE: SARS COV1 AB(IGG)SPIKE,SEMI QN: >150 index — ABNORMAL HIGH (ref <1.00)

## 2021-01-23 ENCOUNTER — Other Ambulatory Visit: Payer: Self-pay

## 2021-01-23 DIAGNOSIS — R7989 Other specified abnormal findings of blood chemistry: Secondary | ICD-10-CM

## 2021-02-01 DIAGNOSIS — M9901 Segmental and somatic dysfunction of cervical region: Secondary | ICD-10-CM | POA: Diagnosis not present

## 2021-02-01 DIAGNOSIS — M2669 Other specified disorders of temporomandibular joint: Secondary | ICD-10-CM | POA: Diagnosis not present

## 2021-02-01 DIAGNOSIS — M542 Cervicalgia: Secondary | ICD-10-CM | POA: Diagnosis not present

## 2021-02-01 DIAGNOSIS — M6240 Contracture of muscle, unspecified site: Secondary | ICD-10-CM | POA: Diagnosis not present

## 2021-02-07 ENCOUNTER — Telehealth: Payer: Self-pay | Admitting: *Deleted

## 2021-02-07 NOTE — Telephone Encounter (Signed)
Patient called requesting refill on Zoloft 50 mg tablet, had annual exam with you on 2/22. Dr.Fontaine previously prescribed Zoloft 50 mg tablet. Okay to refill?

## 2021-02-08 DIAGNOSIS — M9901 Segmental and somatic dysfunction of cervical region: Secondary | ICD-10-CM | POA: Diagnosis not present

## 2021-02-08 DIAGNOSIS — M2669 Other specified disorders of temporomandibular joint: Secondary | ICD-10-CM | POA: Diagnosis not present

## 2021-02-08 DIAGNOSIS — M542 Cervicalgia: Secondary | ICD-10-CM | POA: Diagnosis not present

## 2021-02-08 DIAGNOSIS — M6240 Contracture of muscle, unspecified site: Secondary | ICD-10-CM | POA: Diagnosis not present

## 2021-02-08 MED ORDER — SERTRALINE HCL 50 MG PO TABS: 50.0000 mg | ORAL_TABLET | Freq: Every day | ORAL | 3 refills | Status: DC

## 2021-02-08 NOTE — Telephone Encounter (Signed)
Patient informed, Rx sent.  

## 2021-02-08 NOTE — Telephone Encounter (Signed)
Yes agree to refill if stable.

## 2021-02-22 ENCOUNTER — Other Ambulatory Visit: Payer: Self-pay

## 2021-03-08 ENCOUNTER — Ambulatory Visit (INDEPENDENT_AMBULATORY_CARE_PROVIDER_SITE_OTHER): Payer: Federal, State, Local not specified - PPO | Admitting: Obstetrics & Gynecology

## 2021-03-08 ENCOUNTER — Other Ambulatory Visit: Payer: Self-pay

## 2021-03-08 ENCOUNTER — Encounter: Payer: Self-pay | Admitting: Obstetrics & Gynecology

## 2021-03-08 VITALS — BP 126/84 | Wt 233.0 lb

## 2021-03-08 DIAGNOSIS — R87610 Atypical squamous cells of undetermined significance on cytologic smear of cervix (ASC-US): Secondary | ICD-10-CM | POA: Diagnosis not present

## 2021-03-08 DIAGNOSIS — R7989 Other specified abnormal findings of blood chemistry: Secondary | ICD-10-CM

## 2021-03-08 DIAGNOSIS — N72 Inflammatory disease of cervix uteri: Secondary | ICD-10-CM | POA: Diagnosis not present

## 2021-03-08 DIAGNOSIS — N879 Dysplasia of cervix uteri, unspecified: Secondary | ICD-10-CM | POA: Diagnosis not present

## 2021-03-08 LAB — ALT: ALT: 28 U/L (ref 6–29)

## 2021-03-08 LAB — AST: AST: 27 U/L (ref 10–35)

## 2021-03-08 NOTE — Progress Notes (Signed)
    Grace Wells 1961/08/14 [id number]        60 y.o.  G2P2L2   RP: ASCUS x 2/HPV HR Neg for Colposcopy  HPI: ASCUS x 2, last 01/17/2021 with HPV HR Negative.     OB History  Gravida Para Term Preterm AB Living  2 2       2   SAB IAB Ectopic Multiple Live Births               # Outcome Date GA Lbr Len/2nd Weight Sex Delivery Anes PTL Lv  2 Para           1 Para             Past medical history,surgical history, problem list, medications, allergies, family history and social history were all reviewed and documented in the EPIC chart.   Directed ROS with pertinent positives and negatives documented in the history of present illness/assessment and plan.  Exam:  Vitals:   03/08/21 1450  BP: 126/84  Weight: 233 lb (105.7 kg)   General appearance:  Normal  Colposcopy Procedure Note Grace Wells 03/08/2021  Indications:  ASCUS x 2, HPV HR Neg  Procedure Details  The risks and benefits of the procedure and Verbal informed consent obtained.  Speculum placed in vagina and excellent visualization of cervix achieved, cervix swabbed x 3 with acetic acid solution.  Findings:  Cervix colposcopy: Physical Exam Genitourinary:       Vaginal colposcopy: Normal  Vulvar colposcopy: Normal  Perirectal colposcopy: Normal  The cervix was sprayed with Hurricane before performing the cervical biopsies.  Specimens: Cervical Bx at 3 and 9 O'Clock  Complications: No complication, good hemostasis with Silver Nitrate . Plan:  Management per Bx results   Assessment/Plan:  60 y.o. G2P2   1. ASCUS of cervix with negative high risk HPV Counseling on abnormal Pap with negative HR HPV.  Colposcopy procedure reviewed.  Colposcopy findings explained.  Management per cervical biopsy results.  Post procedure precautions discussed.  2. Elevated liver function tests - ALT - AST  Princess Bruins MD, 3:22 PM 03/08/2021

## 2021-03-08 NOTE — Addendum Note (Signed)
Addended by: Thurnell Garbe A on: 03/08/2021 03:52 PM   Modules accepted: Orders

## 2021-03-09 LAB — TISSUE PATH REPORT

## 2021-03-09 LAB — PATHOLOGY REPORT

## 2021-03-13 ENCOUNTER — Telehealth: Payer: Self-pay | Admitting: *Deleted

## 2021-03-13 NOTE — Telephone Encounter (Signed)
Incorrect chart

## 2022-02-15 ENCOUNTER — Encounter: Payer: Self-pay | Admitting: Gastroenterology

## 2022-02-20 ENCOUNTER — Other Ambulatory Visit: Payer: Self-pay

## 2022-02-20 ENCOUNTER — Encounter: Payer: Self-pay | Admitting: Obstetrics & Gynecology

## 2022-02-20 ENCOUNTER — Other Ambulatory Visit (HOSPITAL_COMMUNITY)
Admission: RE | Admit: 2022-02-20 | Discharge: 2022-02-20 | Disposition: A | Discharge status: Home / Self Care | Payer: Federal, State, Local not specified - PPO | Source: Ambulatory Visit | Attending: Obstetrics & Gynecology | Admitting: Obstetrics & Gynecology

## 2022-02-20 ENCOUNTER — Ambulatory Visit (INDEPENDENT_AMBULATORY_CARE_PROVIDER_SITE_OTHER): Payer: Federal, State, Local not specified - PPO | Admitting: Obstetrics & Gynecology

## 2022-02-20 VITALS — BP 112/88 | HR 66 | Ht 64.75 in | Wt 239.0 lb

## 2022-02-20 DIAGNOSIS — Z113 Encounter for screening for infections with a predominantly sexual mode of transmission: Secondary | ICD-10-CM

## 2022-02-20 DIAGNOSIS — R87610 Atypical squamous cells of undetermined significance on cytologic smear of cervix (ASC-US): Secondary | ICD-10-CM

## 2022-02-20 DIAGNOSIS — Z01419 Encounter for gynecological examination (general) (routine) without abnormal findings: Secondary | ICD-10-CM

## 2022-02-20 DIAGNOSIS — E66813 Obesity, class 3: Secondary | ICD-10-CM

## 2022-02-20 DIAGNOSIS — Z6841 Body Mass Index (BMI) 40.0 and over, adult: Secondary | ICD-10-CM

## 2022-02-20 DIAGNOSIS — Z780 Asymptomatic menopausal state: Secondary | ICD-10-CM | POA: Diagnosis not present

## 2022-02-20 NOTE — Progress Notes (Signed)
? ? ?Grace Wells 28-Nov-1961 [id number] ? ? ?History:    61 y.o. G2P2L2 Widowed x 9 years.  Son lives with her. ?  ?RP:  Established patient presenting for annual gyn exam  ?  ?HPI: Postmenopausal.  No significant menopausal symptoms or any vaginal bleeding. No pelvic pain.  Rectocele, second-degree.  Stable on serial exams.  Asymptomatic to the patient.  Recurrent ASCUS/HPV HR Neg 01/2021.  Colposcopy No Dysplasia 02/2021.  Had left lower abdominal discomfort x 2 in the last 2 weeks, no current pain.  No constipation.  Sexually active recently.  Would like STI screen.  Breasts normal.  Mammography to schedule now. Colonoscopy 2020.  Health labs here today.  BMI 40.08. Will start working in the yard. ? ? ?Past medical history,surgical history, family history and social history were all reviewed and documented in the EPIC chart. ? ?Gynecologic History ?No LMP recorded. Patient is postmenopausal. ? ?Obstetric History ?OB History  ?Gravida Para Term Preterm AB Living  ?_0 ?SAB IAB Ectopic Multiple Live Births  ?           ?  ?# Outcome Date GA Lbr Len/2nd Weight Sex Delivery Anes PTL Lv  ?2 Para           ?1 Para           ? ? ? ?ROS: A ROS was performed and pertinent positives and negatives are included in the history. ? GENERAL: No fevers or chills. HEENT: No change in vision, no earache, sore throat or sinus congestion. NECK: No pain or stiffness. CARDIOVASCULAR: No chest pain or pressure. No palpitations. PULMONARY: No shortness of breath, cough or wheeze. GASTROINTESTINAL: No abdominal pain, nausea, vomiting or diarrhea, melena or bright red blood per rectum. GENITOURINARY: No urinary frequency, urgency, hesitancy or dysuria. MUSCULOSKELETAL: No joint or muscle pain, no back pain, no recent trauma. DERMATOLOGIC: No rash, no itching, no lesions. ENDOCRINE: No polyuria, polydipsia, no heat or cold intolerance. No recent change in weight. HEMATOLOGICAL: No anemia or easy bruising or bleeding. NEUROLOGIC:  No headache, seizures, numbness, tingling or weakness. PSYCHIATRIC: No depression, no loss of interest in normal activity or change in sleep pattern.  ?  ? ?Exam: ? ? ?BP 112/88   Pulse 66   Ht 5' 4.75" (1.645 m)   Wt 239 lb (108.4 kg)   SpO2 98%   BMI 40.08 kg/m?  ? ?Body mass index is 40.08 kg/m?. ? ?General appearance : Well developed well nourished female. No acute distress ?HEENT: Eyes: no retinal hemorrhage or exudates,  Neck supple, trachea midline, no carotid bruits, no thyroidmegaly ?Lungs: Clear to auscultation, no rhonchi or wheezes, or rib retractions  ?Heart: Regular rate and rhythm, no murmurs or gallops ?Breast:Examined in sitting and supine position were symmetrical in appearance, no palpable masses or tenderness,  no skin retraction, no nipple inversion, no nipple discharge, no skin discoloration, no axillary or supraclavicular lymphadenopathy ?Abdomen: no palpable masses or tenderness, no rebound or guarding ?Extremities: no edema or skin discoloration or tenderness ? ?Pelvic: Vulva: Normal ?            Vagina: No gross lesions or discharge ? Cervix: No gross lesions or discharge.  Pap reflex, Gono-Chlam done. ? Uterus  AV, normal size, shape and consistency, non-tender and mobile ? Adnexa  Without masses or tenderness ? Anus: Normal ? ? ?Assessment/Plan:  61 y.o. female for annual exam  ? ?1. Encounter for routine  gynecological examination with Papanicolaou smear of cervix ?Postmenopausal.  No significant menopausal symptoms or any vaginal bleeding. No pelvic pain.  Rectocele, second-degree.  Stable on serial exams.  Asymptomatic to the patient.  Recurrent ASCUS/HPV HR Neg 01/2021.  Colposcopy No Dysplasia 02/2021.  Had left lower abdominal discomfort x 2 in the last 2 weeks, no current pain.  No constipation.  Sexually active recently.  Would like STI screen.  Breasts normal.  Mammography to schedule now. Colonoscopy 2020.  Health labs here today.  BMI 40.08. Will start working in the yard. ?-  CBC ?- Comp Met (CMET) ?- TSH ?- Lipid Profile ?- Vitamin D (25 hydroxy) ?- Cytology - PAP( [place]) ? ?2. ASCUS of cervix with negative high risk HPV ?- Cytology - PAP( [place]) ? ?3. Postmenopause ?Postmenopausal.  No significant menopausal symptoms or any vaginal bleeding. No pelvic pain.  ? ?4. Screen for STD (sexually transmitted disease) ?Condom use strongly recommended. ?- HIV antibody (with reflex) ?- RPR ?- Hepatitis B Surface AntiGEN ?- Hepatitis C Antibody ?- Cytology - PAP( [place])- Gono-Chlam ? ?5. Class 3 severe obesity due to excess calories without serious comorbidity with body mass index (BMI) of 40.0 to 44.9 in adult [facility])  ?Lower calorie/carb diet.  Increase fitness. ? ?Princess Bruins MD, 3:17 PM 02/20/2022 ? ?  ?

## 2022-02-21 ENCOUNTER — Encounter: Payer: Self-pay | Admitting: Obstetrics & Gynecology

## 2022-02-21 LAB — LIPID PANEL
Cholesterol: 142 mg/dL (ref <200)
HDL: 41 mg/dL — ABNORMAL LOW (ref >=50)
LDL Cholesterol (Calc): 82 mg/dL (calc)
Non-HDL Cholesterol (Calc): 101 mg/dL (calc) (ref <130)
Total CHOL/HDL Ratio: 3.5 (calc) (ref <5.0)
Triglycerides: 95 mg/dL (ref <150)

## 2022-02-21 LAB — COMPREHENSIVE METABOLIC PANEL
AG Ratio: 1.8 (calc) (ref 1.0–2.5)
ALT: 25 U/L (ref 6–29)
AST: 26 U/L (ref 10–35)
Albumin: 4.5 g/dL (ref 3.6–5.1)
Alkaline phosphatase (APISO): 76 U/L (ref 37–153)
BUN: 10 mg/dL (ref 7–25)
CO2: 27 mmol/L (ref 20–32)
Calcium: 9.6 mg/dL (ref 8.6–10.4)
Chloride: 104 mmol/L (ref 98–110)
Creat: 0.67 mg/dL (ref 0.50–1.03)
Globulin: 2.5 g/dL (calc) (ref 1.9–3.7)
Glucose, Bld: 82 mg/dL (ref 65–99)
Potassium: 4.5 mmol/L (ref 3.5–5.3)
Sodium: 141 mmol/L (ref 135–146)
Total Bilirubin: 1.1 mg/dL (ref 0.2–1.2)
Total Protein: 7 g/dL (ref 6.1–8.1)

## 2022-02-21 LAB — CYTOLOGY - PAP
Chlamydia: NEGATIVE
Comment: NEGATIVE
Comment: NORMAL
Diagnosis: NEGATIVE
Neisseria Gonorrhea: NEGATIVE

## 2022-02-21 LAB — CBC
HCT: 42.5 % (ref 35.0–45.0)
Hemoglobin: 14.2 g/dL (ref 11.7–15.5)
MCH: 28.5 pg (ref 27.0–33.0)
MCHC: 33.4 g/dL (ref 32.0–36.0)
MCV: 85.3 fL (ref 80.0–100.0)
MPV: 12.7 fL — ABNORMAL HIGH (ref 7.5–12.5)
Platelets: 184 10*3/uL (ref 140–400)
RBC: 4.98 10*6/uL (ref 3.80–5.10)
RDW: 13.9 % (ref 11.0–15.0)
WBC: 5.3 10*3/uL (ref 3.8–10.8)

## 2022-02-21 LAB — HEPATITIS C ANTIBODY
Hepatitis C Ab: NONREACTIVE
SIGNAL TO CUT-OFF: <0.02 (ref <1.00)

## 2022-02-21 LAB — TSH: TSH: 2.33 mIU/L (ref 0.40–4.50)

## 2022-02-21 LAB — HIV ANTIBODY (ROUTINE TESTING W REFLEX): HIV 1&2 Ab, 4th Generation: NONREACTIVE

## 2022-02-21 LAB — RPR: RPR Ser Ql: NONREACTIVE

## 2022-02-21 LAB — HEPATITIS B SURFACE ANTIGEN: Hepatitis B Surface Ag: NONREACTIVE

## 2022-02-21 LAB — VITAMIN D 25 HYDROXY (VIT D DEFICIENCY, FRACTURES): Vit D, 25-Hydroxy: 87 ng/mL (ref 30–100)

## 2022-05-09 ENCOUNTER — Other Ambulatory Visit: Payer: Self-pay | Admitting: Obstetrics & Gynecology

## 2022-05-09 NOTE — Telephone Encounter (Signed)
Annual exam was 02/20/22

## 2022-05-29 DIAGNOSIS — M9901 Segmental and somatic dysfunction of cervical region: Secondary | ICD-10-CM | POA: Diagnosis not present

## 2022-05-29 DIAGNOSIS — M542 Cervicalgia: Secondary | ICD-10-CM | POA: Diagnosis not present

## 2022-05-29 DIAGNOSIS — M6240 Contracture of muscle, unspecified site: Secondary | ICD-10-CM | POA: Diagnosis not present

## 2022-05-29 DIAGNOSIS — M2669 Other specified disorders of temporomandibular joint: Secondary | ICD-10-CM | POA: Diagnosis not present

## 2022-05-30 DIAGNOSIS — M6240 Contracture of muscle, unspecified site: Secondary | ICD-10-CM | POA: Diagnosis not present

## 2022-05-30 DIAGNOSIS — M9901 Segmental and somatic dysfunction of cervical region: Secondary | ICD-10-CM | POA: Diagnosis not present

## 2022-05-30 DIAGNOSIS — M2669 Other specified disorders of temporomandibular joint: Secondary | ICD-10-CM | POA: Diagnosis not present

## 2022-05-30 DIAGNOSIS — M542 Cervicalgia: Secondary | ICD-10-CM | POA: Diagnosis not present

## 2022-08-21 DIAGNOSIS — M7062 Trochanteric bursitis, left hip: Secondary | ICD-10-CM | POA: Diagnosis not present

## 2022-08-21 DIAGNOSIS — M545 Low back pain, unspecified: Secondary | ICD-10-CM | POA: Diagnosis not present

## 2022-08-21 DIAGNOSIS — M25511 Pain in right shoulder: Secondary | ICD-10-CM | POA: Diagnosis not present

## 2022-09-12 DIAGNOSIS — G4733 Obstructive sleep apnea (adult) (pediatric): Secondary | ICD-10-CM | POA: Diagnosis not present

## 2022-09-13 DIAGNOSIS — G4733 Obstructive sleep apnea (adult) (pediatric): Secondary | ICD-10-CM | POA: Diagnosis not present

## 2022-11-07 DIAGNOSIS — Z1231 Encounter for screening mammogram for malignant neoplasm of breast: Secondary | ICD-10-CM | POA: Diagnosis not present

## 2022-11-14 ENCOUNTER — Encounter: Payer: Self-pay | Admitting: Obstetrics & Gynecology

## 2022-11-28 ENCOUNTER — Telehealth: Payer: Self-pay | Admitting: *Deleted

## 2022-11-28 NOTE — Telephone Encounter (Signed)
Dr.Lavoie gave me a verbal no that this is not her specialty as GYN and patient will need to find a PCP to help her with this.  Detailed message on patient voicemail per DPR access.

## 2022-11-28 NOTE — Telephone Encounter (Signed)
Patient called stating she needs an order to get her Cpap supplies. Patient reports having Cpap for years, no PCP, states a doctor at [facility] use to approval order for her. [facility] has closed. I explained she needs to find a PCP so she can have provider to help her with none GYN issues (states you are her only provider) patient gave me the name of [facility] [phone] and I spoke with Lovena Le and she faxed the order. I did explain to patient that you may not feel comfortable signing order as her Gynecologist, but I would check.   Order on your desk to review.   Please advise

## 2023-05-23 ENCOUNTER — Other Ambulatory Visit: Payer: Self-pay | Admitting: Obstetrics & Gynecology

## 2023-05-23 DIAGNOSIS — Z780 Asymptomatic menopausal state: Secondary | ICD-10-CM

## 2023-05-23 NOTE — Telephone Encounter (Signed)
Med refill request: sertraline 50mg  Last AEX: 02/20/22 Next AEX: none scheduled  Refill denied, sent to provider for review.

## 2023-08-22 ENCOUNTER — Other Ambulatory Visit: Payer: Self-pay | Admitting: Obstetrics & Gynecology

## 2023-08-22 DIAGNOSIS — Z780 Asymptomatic menopausal state: Secondary | ICD-10-CM

## 2023-08-22 NOTE — Telephone Encounter (Signed)
Med refill request: Zoloft Last AEX: 02/20/22 Next AEX: not scheduled, last filled 05/23/23 by ML  Last MMG (if hormonal med) 11/07/22 Refill authorized: Please Advise, #[phone], 0 RF

## 2023-11-18 ENCOUNTER — Encounter: Payer: Self-pay | Admitting: Obstetrics and Gynecology

## 2023-11-20 ENCOUNTER — Other Ambulatory Visit: Payer: Self-pay | Admitting: Radiology

## 2023-11-20 DIAGNOSIS — Z780 Asymptomatic menopausal state: Secondary | ICD-10-CM

## 2023-11-20 NOTE — Telephone Encounter (Signed)
Med refill request: sertraline 50 mg Last AEX: 02/20/22 Next AEX: none scheduled Last MMG (if hormonal med) n/a  Refill sent to provider for approval or denial as appropriate.

## 2023-12-27 ENCOUNTER — Encounter: Payer: Self-pay | Admitting: *Deleted

## 2023-12-27 ENCOUNTER — Ambulatory Visit
Admission: EM | Admit: 2023-12-27 | Discharge: 2023-12-27 | Disposition: A | Discharge status: Home / Self Care | Payer: Federal, State, Local not specified - PPO | Attending: Physician Assistant | Admitting: Physician Assistant

## 2023-12-27 ENCOUNTER — Other Ambulatory Visit: Payer: Self-pay

## 2023-12-27 DIAGNOSIS — R0981 Nasal congestion: Secondary | ICD-10-CM | POA: Diagnosis not present

## 2023-12-27 DIAGNOSIS — J0100 Acute maxillary sinusitis, unspecified: Secondary | ICD-10-CM

## 2023-12-27 MED ORDER — DOXYCYCLINE HYCLATE 100 MG PO CAPS: 100.0000 mg | ORAL_CAPSULE | Freq: Two times a day (BID) | ORAL | 0 refills | Status: DC

## 2023-12-27 NOTE — ED Provider Notes (Signed)
 [facility]    CSN: [id number] Arrival date & time: 12/27/23  1142      History   Chief Complaint Chief Complaint  Patient presents with   Nasal Congestion    HPI Grace Wells is a 63 y.o. female.   Patient presents today with a several week history of nasal congestion.  Reports associated cough, sinus pressure, headache.  Denies any fever, chest pain, shortness of breath, nausea, vomiting.  She has been using Mucinex  and cold/flu medication as well as nasal saline without improvement of symptoms.  She denies any recent antibiotics or steroids.  She denies any recent sinus surgery.  She does have a history of sinus infections with similar presentation requiring antibiotic use.  She is eating and drinking normally.  Denies any known sick contacts.    Past Medical History:  Diagnosis Date   Anxiety    ASCUS of cervix with negative high risk HPV 03/2017, 07/2019   Cervical dysplasia    LGSIL C&B 6/08, LGSIL pap12/09 & 6/10, WNL after   Diverticulitis    Fatty liver 07/2012   mild elevated LFTs   GERD (gastroesophageal reflux disease)    Obesity    Psoriasis    Rectocele 2014    Patient Active Problem List   Diagnosis Date Noted   OSA on CPAP 07/10/2018   Rectocele    DIVERTICULOSIS, COLON 08/12/2009   FATTY LIVER DISEASE 08/12/2009   OBESITY 07/18/2009   HEMORRHOIDS 07/15/2009   GERD 07/15/2009   HIATAL HERNIA 07/15/2009   IRRITABLE BOWEL SYNDROME 07/15/2009   ABDOMINAL PAIN, LOWER 07/15/2009   TRANSAMINASES, SERUM, ELEVATED 07/15/2009   NEPHROLITHIASIS, HX OF 07/15/2009    Past Surgical History:  Procedure Laterality Date   COLONOSCOPY     COLPOSCOPY     ROOT CANAL     UPPER GASTROINTESTINAL ENDOSCOPY      OB History     Gravida  2   Para  2   Term      Preterm      AB      Living  2      SAB      IAB      Ectopic      Multiple      Live Births               Home Medications    Prior to Admission  medications   Medication Sig Start Date End Date Taking? Authorizing Provider  Ascorbic Acid (VITAMIN C PO) Take by mouth daily.   Yes [provider]  Cholecalciferol (VITAMIN D3) 50 MCG (2000 UT) TABS Take by mouth daily.   Yes [provider]  doxycycline  (VIBRAMYCIN ) 100 MG capsule Take 1 capsule (100 mg total) by mouth 2 (two) times daily. 12/27/23  Yes Taylen Wendland K, PA-C  Multiple Vitamin (MULTIVITAMIN) tablet Take 1 tablet by mouth daily.   Yes [provider]  Multiple Vitamins-Minerals (HAIR/SKIN/NAILS/BIOTIN PO) Take 1 capsule by mouth daily.   Yes [provider]  sertraline  (ZOLOFT ) 50 MG tablet TAKE 1 TABLET BY MOUTH EVERY DAY 08/22/23  Yes Chrzanowski, Jami B, NP  vitamin B-12 (CYANOCOBALAMIN) 1000 MCG tablet Take 1,000 mcg by mouth daily.   Yes [provider]  AMBULATORY NON FORMULARY MEDICATION Medication Name: Bee Caps 2 tablets by mouth daily Patient not taking: Reported on 12/27/2023    [provider]  Multiple Vitamins-Minerals (ZINC PO) Take 100 mg by mouth daily. Patient not taking: Reported  on 12/27/2023    [provider]    Family History Family History  Problem Relation Age of Onset   Diabetes Maternal Grandmother    Emphysema Maternal Grandmother    Colon cancer Paternal Grandmother    Heart disease Paternal Grandfather        stroke   Stroke Paternal Grandfather    Cancer Mother        Lymphoma   Lung cancer Father        smoker   Hypertension Brother    Cancer Maternal Grandfather        Hodgkins   Esophageal cancer Neg Hx    Rectal cancer Neg Hx    Stomach cancer Neg Hx     Social History Social History   Tobacco Use   Smoking status: Never   Smokeless tobacco: Never  Vaping Use   Vaping status: Never Used  Substance Use Topics   Alcohol use: Not Currently   Drug use: No     Allergies   Codeine and Penicillins   Review of Systems Review of Systems  Constitutional:   Negative for activity change, appetite change, fatigue and fever.  HENT:  Positive for congestion, postnasal drip and sinus pressure. Negative for sneezing and sore throat.   Respiratory:  Positive for cough. Negative for shortness of breath.   Cardiovascular:  Negative for chest pain.  Gastrointestinal:  Negative for abdominal pain, diarrhea, nausea and vomiting.  Neurological:  Positive for headaches. Negative for dizziness and light-headedness.     Physical Exam Triage Vital Signs ED Triage Vitals  Encounter Vitals Group     BP 12/27/23 1325 109/73     Systolic BP Percentile --      Diastolic BP Percentile --      Pulse Rate 12/27/23 1325 77     Resp 12/27/23 1325 16     Temp 12/27/23 1325 98.4 F (36.9 C)     Temp Source 12/27/23 1325 Oral     SpO2 12/27/23 1325 97 %     Weight --      Height --      Head Circumference --      Peak Flow --      Pain Score 12/27/23 1323 4     Pain Loc --      Pain Education --      Exclude from Growth Chart --    No data found.  Updated Vital Signs BP 109/73 (BP Location: Left Arm)   Pulse 77   Temp 98.4 F (36.9 C) (Oral)   Resp 16   SpO2 97%   Visual Acuity Right Eye Distance:   Left Eye Distance:   Bilateral Distance:    Right Eye Near:   Left Eye Near:    Bilateral Near:     Physical Exam Vitals reviewed.  Constitutional:      General: She is awake. She is not in acute distress.    Appearance: Normal appearance. She is well-developed. She is not ill-appearing.     Comments: Very pleasant female appears stated age in no acute distress sitting comfortably in exam room  HENT:     Head: Normocephalic and atraumatic.     Right Ear: Tympanic membrane, ear canal and external ear normal. Tympanic membrane is not erythematous or bulging.     Left Ear: Tympanic membrane, ear canal and external ear normal. Tympanic membrane is not erythematous or bulging.     Nose:     Right Sinus:  Maxillary sinus tenderness present. No  frontal sinus tenderness.     Left Sinus: Maxillary sinus tenderness present. No frontal sinus tenderness.     Mouth/Throat:     Pharynx: Uvula midline. Postnasal drip present. No oropharyngeal exudate or posterior oropharyngeal erythema.  Cardiovascular:     Rate and Rhythm: Normal rate and regular rhythm.     Heart sounds: Normal heart sounds, S1 normal and S2 normal. No murmur heard. Pulmonary:     Effort: Pulmonary effort is normal.     Breath sounds: Normal breath sounds. No wheezing, rhonchi or rales.     Comments: Clear to auscultation bilaterally Psychiatric:        Behavior: Behavior is cooperative.      UC Treatments / Results  Labs (all labs ordered are listed, but only abnormal results are displayed) Labs Reviewed - No data to display  EKG   Radiology No results found.  Procedures Procedures (including critical care time)  Medications Ordered in UC Medications - No data to display  Initial Impression / Assessment and Plan / UC Course  I have reviewed the triage vital signs and the nursing notes.  Pertinent labs & imaging results that were available during my care of the patient were reviewed by me and considered in my medical decision making (see chart for details).     Patient is well-appearing, afebrile, nontoxic, nontachycardic.  Chest x-ray was deferred as she had no adventitious lung sounds on exam and her oxygen saturation was 97%.  Viral testing was deferred as she has been symptomatic for several weeks.  Given her prolonged and worsening symptoms will cover for secondary bacterial infection.  Given her history of allergy to penicillin will use doxycycline .  We discussed that she should avoid prolonged sun exposure while on this medication as it can cause photosensitivity.  She can use over-the-counter medication including Mucinex , Flonase , nasal saline/sinus rinses.  If she is not feeling better within a week she is to return for reevaluation.  Discussed  that if anything worsens and she has worsening cough, high fever, shortness of breath, chest pain, nausea/vomiting she needs to be seen immediately.  Strict return precautions given.  Final Clinical Impressions(s) / UC Diagnoses   Final diagnoses:  Acute non-recurrent maxillary sinusitis  Nasal congestion     Discharge Instructions      We are treating you for sinus infection.  Start doxycycline  100 mg twice daily for 10 days.  Stay out of the sun while on this medication.  Continue over-the-counter medications including Mucinex , Flonase , Tylenol.  I also recommend nasal saline and sinus rinses.  Make sure you rest and drink plenty of fluid.  If your symptoms are not improving within a week please return for reevaluation.  If anything worsens you need to be seen immediately.     ED Prescriptions     Medication Sig Dispense Auth. Provider   doxycycline  (VIBRAMYCIN ) 100 MG capsule Take 1 capsule (100 mg total) by mouth 2 (two) times daily. 20 capsule Joslyne Marshburn K, PA-C      PDMP not reviewed this encounter.   Sherrell Rocky POUR, PA-C 12/27/23 1339

## 2023-12-27 NOTE — Discharge Instructions (Signed)
 We are treating you for sinus infection.  Start doxycycline  100 mg twice daily for 10 days.  Stay out of the sun while on this medication.  Continue over-the-counter medications including Mucinex , Flonase , Tylenol.  I also recommend nasal saline and sinus rinses.  Make sure you rest and drink plenty of fluid.  If your symptoms are not improving within a week please return for reevaluation.  If anything worsens you need to be seen immediately.

## 2023-12-27 NOTE — ED Triage Notes (Signed)
 Pt taking mucinex for nasal congestion for a "couple of weeks". Concerned for sinus infection

## 2024-05-07 ENCOUNTER — Ambulatory Visit (INDEPENDENT_AMBULATORY_CARE_PROVIDER_SITE_OTHER): Admitting: Obstetrics and Gynecology

## 2024-05-07 ENCOUNTER — Other Ambulatory Visit (HOSPITAL_COMMUNITY)
Admission: RE | Admit: 2024-05-07 | Discharge: 2024-05-07 | Disposition: A | Discharge status: Home / Self Care | Source: Ambulatory Visit | Attending: Obstetrics and Gynecology | Admitting: Obstetrics and Gynecology

## 2024-05-07 ENCOUNTER — Encounter: Payer: Self-pay | Admitting: Obstetrics and Gynecology

## 2024-05-07 VITALS — BP 116/70 | HR 69 | Ht 64.25 in | Wt 225.0 lb

## 2024-05-07 DIAGNOSIS — Z1331 Encounter for screening for depression: Secondary | ICD-10-CM

## 2024-05-07 DIAGNOSIS — N816 Rectocele: Secondary | ICD-10-CM | POA: Diagnosis not present

## 2024-05-07 DIAGNOSIS — Z780 Asymptomatic menopausal state: Secondary | ICD-10-CM | POA: Insufficient documentation

## 2024-05-07 DIAGNOSIS — R350 Frequency of micturition: Secondary | ICD-10-CM | POA: Diagnosis not present

## 2024-05-07 DIAGNOSIS — E2839 Other primary ovarian failure: Secondary | ICD-10-CM

## 2024-05-07 DIAGNOSIS — Z1211 Encounter for screening for malignant neoplasm of colon: Secondary | ICD-10-CM

## 2024-05-07 DIAGNOSIS — Z1504 Genetic susceptibility to malignant neoplasm of endometrium: Secondary | ICD-10-CM

## 2024-05-07 DIAGNOSIS — Z01419 Encounter for gynecological examination (general) (routine) without abnormal findings: Secondary | ICD-10-CM

## 2024-05-07 DIAGNOSIS — Z1231 Encounter for screening mammogram for malignant neoplasm of breast: Secondary | ICD-10-CM

## 2024-05-07 DIAGNOSIS — Z124 Encounter for screening for malignant neoplasm of cervix: Secondary | ICD-10-CM | POA: Diagnosis not present

## 2024-05-07 LAB — URINALYSIS, COMPLETE W/RFL CULTURE
Bacteria, UA: NONE SEEN /HPF
Bilirubin Urine: NEGATIVE
Glucose, UA: NEGATIVE
Hgb urine dipstick: NEGATIVE
Hyaline Cast: NONE SEEN /LPF
Ketones, ur: NEGATIVE
Leukocyte Esterase: NEGATIVE
Nitrites, Initial: NEGATIVE
Protein, ur: NEGATIVE
RBC / HPF: NONE SEEN /HPF (ref 0–2)
Specific Gravity, Urine: 1.003 (ref 1.001–1.035)
WBC, UA: NONE SEEN /HPF (ref 0–5)
pH: 6 (ref 5.0–8.0)

## 2024-05-07 LAB — NO CULTURE INDICATED

## 2024-05-07 MED ORDER — INTRAROSA 6.5 MG VA INST: 1.0000 | VAGINAL_INSERT | Freq: Every evening | VAGINAL | 12 refills | Status: AC | PRN

## 2024-05-07 NOTE — Progress Notes (Signed)
 63 y.o. y.o. female here for annual exam. No LMP recorded. Patient is postmenopausal.    G2P2L2 Widowed x 9 years.  Son lives with her.   RP:  Established patient presenting for annual gyn exam    HPI: Postmenopausal.  No significant menopausal symptoms or any vaginal bleeding. No pelvic pain.  Rectocele, second-degree. Had >9lb babies  x2 reports splinting. Referral to urogyn. Begin intrarosa or vaginal estrogen based on what insurance covers.  Stable on serial exams.  Asymptomatic to the patient.  Recurrent ASCUS/HPV HR Neg 01/2021.  Colposcopy No Dysplasia 02/2021.   Sexually active recently.  Would like STI screen.  Breasts normal. MMG 11/24.  Colonoscopy 2020 one sessile polyp removed. Has GM with colon cancer. Repeat 3-5 years. Referral placed.  Health labs here today.  BMI 40.08. Will start working in the yard. Dxa not done: to get baseline Has sleep apnea.  Needs new machine.  She will let us  know machine type for prescription. Sister with endometrial cancer: to get PUS US . Denies any PMB but will evaluate endometrial lining. No HRT use Body mass index is 38.32 kg/m.     05/07/2024    3:10 PM 12/21/2020    3:44 PM 03/21/2020    4:04 PM  Depression screen PHQ 2/9  Decreased Interest 0 0 0  Down, Depressed, Hopeless 0 0 0  PHQ - 2 Score 0 0 0    Blood pressure 116/70, pulse 69, height 5' 4.25" (1.632 m), weight 225 lb (102.1 kg), SpO2 98%.     Component Value Date/Time   DIAGPAP  02/20/2022 1533    - Negative for intraepithelial lesion or malignancy (NILM)   ADEQPAP  02/20/2022 1533    Satisfactory for evaluation; transformation zone component PRESENT.    GYN HISTORY:    Component Value Date/Time   DIAGPAP  02/20/2022 1533    - Negative for intraepithelial lesion or malignancy (NILM)   ADEQPAP  02/20/2022 1533    Satisfactory for evaluation; transformation zone component PRESENT.    OB History  Gravida Para Term Preterm AB Living  2 2 2   2   SAB IAB Ectopic  Multiple Live Births          # Outcome Date GA Lbr Len/2nd Weight Sex Type Anes PTL Lv  2 Term           1 Term             Past Medical History:  Diagnosis Date   Anxiety    ASCUS of cervix with negative high risk HPV 03/2017, 07/2019   Cervical dysplasia    LGSIL C&B 6/08, LGSIL pap12/09 & 6/10, WNL after   Diverticulitis    Fatty liver 07/2012   mild elevated LFTs   GERD (gastroesophageal reflux disease)    Obesity    Psoriasis    Rectocele 2014    Past Surgical History:  Procedure Laterality Date   COLONOSCOPY     COLPOSCOPY     ROOT CANAL     UPPER GASTROINTESTINAL ENDOSCOPY      Current Outpatient Medications on File Prior to Visit  Medication Sig Dispense Refill   Ascorbic Acid (VITAMIN C PO) Take by mouth daily.     Cholecalciferol (VITAMIN D3) 50 MCG (2000 UT) TABS Take by mouth daily.     Multiple Vitamin (MULTIVITAMIN) tablet Take 1 tablet by mouth daily.     Multiple Vitamins-Minerals (HAIR/SKIN/NAILS/BIOTIN PO) Take 1 capsule by mouth daily.  Multiple Vitamins-Minerals (ZINC PO) Take 100 mg by mouth daily.     sertraline  (ZOLOFT ) 50 MG tablet TAKE 1 TABLET BY MOUTH EVERY DAY 90 tablet 0   vitamin B-12 (CYANOCOBALAMIN) 1000 MCG tablet Take 1,000 mcg by mouth daily.     No current facility-administered medications on file prior to visit.    Social History   Socioeconomic History   Marital status: Widowed    Spouse name: Not on file   Number of children: Not on file   Years of education: Not on file   Highest education level: Not on file  Occupational History   Not on file  Tobacco Use   Smoking status: Never   Smokeless tobacco: Never  Vaping Use   Vaping status: Never Used  Substance and Sexual Activity   Alcohol use: Yes    Comment: occ   Drug use: No   Sexual activity: Yes    Partners: Male    Birth control/protection: Post-menopausal    Comment: 1st intercourse 63 yo-Fewer than 5 partners  Other Topics Concern   Not on file   Social History Narrative   Not on file   Social Drivers of Health   Financial Resource Strain: Not on file  Food Insecurity: Not on file  Transportation Needs: Not on file  Physical Activity: Not on file  Stress: Not on file  Social Connections: Not on file  Intimate Partner Violence: Not on file    Family History  Problem Relation Age of Onset   Diabetes Maternal Grandmother    Emphysema Maternal Grandmother    Colon cancer Paternal Grandmother    Heart disease Paternal Grandfather        stroke   Stroke Paternal Grandfather    Cancer Mother        Lymphoma   Lung cancer Father        smoker   Hypertension Brother    Cancer Maternal Grandfather        Hodgkins   Esophageal cancer Neg Hx    Rectal cancer Neg Hx    Stomach cancer Neg Hx      Allergies  Allergen Reactions   Codeine Nausea And Vomiting   Penicillins     Fingers turned blue, red splotches, started swelling      Patient's last menstrual period was No LMP recorded. Patient is postmenopausal..          Sexually active: yes  Exercising: watching diet   Review of Systems Alls systems reviewed and are negative.     Physical Exam Constitutional:      Appearance: Normal appearance.  Genitourinary:     Vulva and urethral meatus normal.     No lesions in the vagina.     Right Labia: No rash, lesions or skin changes.    Left Labia: No lesions, skin changes or rash.    No vaginal discharge or tenderness.     Posterior vaginal prolapse present.    Moderate vaginal atrophy present.     Right Adnexa: not tender, not palpable and no mass present.    Left Adnexa: not tender, not palpable and no mass present.    No cervical motion tenderness or discharge.     Uterus is not enlarged, tender or irregular.  Breasts:    Right: Normal.     Left: Normal.  HENT:     Head: Normocephalic.  Neck:     Thyroid : No thyroid  mass, thyromegaly or thyroid  tenderness.  Cardiovascular:  Rate and Rhythm:  Normal rate and regular rhythm.     Heart sounds: Normal heart sounds, S1 normal and S2 normal.  Pulmonary:     Effort: Pulmonary effort is normal.     Breath sounds: Normal breath sounds and air entry.  Abdominal:     General: There is no distension.     Palpations: Abdomen is soft. There is no mass.     Tenderness: There is no abdominal tenderness. There is no guarding or rebound.  Musculoskeletal:        General: Normal range of motion.     Cervical back: Full passive range of motion without pain, normal range of motion and neck supple. No tenderness.     Right lower leg: No edema.     Left lower leg: No edema.  Neurological:     Mental Status: She is alert.  Skin:    General: Skin is warm.  Psychiatric:        Mood and Affect: Mood normal.        Behavior: Behavior normal.        Thought Content: Thought content normal.  Vitals and nursing note reviewed. Exam conducted with a chaperone present.       A:         Well Woman GYN exam                             P:        Pap smear collected today Encouraged annual mammogram screening Colon cancer screening referral placed today DXA ordered today Labs and immunizations ordered today Encouraged healthy lifestyle practices Encouraged Vit D and Calcium    Urogyn for evaluation of rectocele and urinary frequency They will call UROGYN REFERRAL: Dr. Vara Gentle and Dr. Frutoso Jing at [facility] office: [address], [address] [phone]  The referral was placed but it may take a couple of weeks for the visit to be coordinated.  Schedule bone scan [facility] should call you to schedule  Schedule PUS for this office in 2 weeks   Colonoscopy referral has been placed.  They should call you  Alyssa Backbone has been sent to [employer]script. They will call you for shipping and costs.  Use this daily for vaginal support.  Send us  your CPAP machine information.  Aleda Hurl can help me send the  prescription   Lab results will come back in [place].  Make sure this is set up  Take daily vit D 2000 D3 and calcium 1200mg .  This is over the counter   It was nice seeing you today Dr. Tia Flowers    No follow-ups on file.  Reinaldo Caras

## 2024-05-07 NOTE — Patient Instructions (Addendum)
 To do list:  Urogyn for evaluation of rectocele and urinary frequency They will call UROGYN REFERRAL: Dr. Vara Gentle and Dr. Frutoso Jing at [facility] with [facility] office: [address], [phone]  The referral was placed but it may take a couple of weeks for the visit to be coordinated.  Schedule bone scan [facility] should call you to schedule  Schedule PUS for this office in 2 weeks   Colonoscopy referral has been placed.  They should call you  Alyssa Backbone has been sent to [employer]script. They will call you for shipping and costs.  Use this daily for vaginal support.  Send us  your CPAP machine information.  Aleda Hurl can help me send the prescription   Lab results will come back in [place].  Make sure this is set up  Take daily vit D 2000 D3 and calcium 1200mg .  This is over the counter   It was nice seeing you today Dr. Tia Flowers

## 2024-05-08 LAB — CBC
HCT: 41.9 % (ref 35.0–45.0)
Hemoglobin: 13.9 g/dL (ref 11.7–15.5)
MCH: 28.9 pg (ref 27.0–33.0)
MCHC: 33.2 g/dL (ref 32.0–36.0)
MCV: 87.1 fL (ref 80.0–100.0)
MPV: 12.4 fL (ref 7.5–12.5)
Platelets: 182 10*3/uL (ref 140–400)
RBC: 4.81 10*6/uL (ref 3.80–5.10)
RDW: 13.5 % (ref 11.0–15.0)
WBC: 6.1 10*3/uL (ref 3.8–10.8)

## 2024-05-08 LAB — HEMOGLOBIN A1C
Hgb A1c MFr Bld: 5 % (ref <5.7)
Mean Plasma Glucose: 97 mg/dL
eAG (mmol/L): 5.4 mmol/L

## 2024-05-08 LAB — COMPREHENSIVE METABOLIC PANEL WITH GFR
AG Ratio: 1.7 (calc) (ref 1.0–2.5)
ALT: 25 U/L (ref 6–29)
AST: 26 U/L (ref 10–35)
Albumin: 4.3 g/dL (ref 3.6–5.1)
Alkaline phosphatase (APISO): 76 U/L (ref 37–153)
BUN: 10 mg/dL (ref 7–25)
CO2: 28 mmol/L (ref 20–32)
Calcium: 9.5 mg/dL (ref 8.6–10.4)
Chloride: 104 mmol/L (ref 98–110)
Creat: 0.58 mg/dL (ref 0.50–1.05)
Globulin: 2.5 g/dL (ref 1.9–3.7)
Glucose, Bld: 86 mg/dL (ref 65–99)
Potassium: 4.6 mmol/L (ref 3.5–5.3)
Sodium: 140 mmol/L (ref 135–146)
Total Bilirubin: 1.1 mg/dL (ref 0.2–1.2)
Total Protein: 6.8 g/dL (ref 6.1–8.1)
eGFR: 103 mL/min/{1.73_m2} (ref >=60)

## 2024-05-08 LAB — LIPID PANEL
Cholesterol: 140 mg/dL (ref <200)
HDL: 42 mg/dL — ABNORMAL LOW (ref >=50)
LDL Cholesterol (Calc): 80 mg/dL
Non-HDL Cholesterol (Calc): 98 mg/dL (ref <130)
Total CHOL/HDL Ratio: 3.3 (calc) (ref <5.0)
Triglycerides: 99 mg/dL (ref <150)

## 2024-05-08 LAB — VITAMIN D 25 HYDROXY (VIT D DEFICIENCY, FRACTURES): Vit D, 25-Hydroxy: 98 ng/mL (ref 30–100)

## 2024-05-08 LAB — TSH: TSH: 1.77 m[IU]/L (ref 0.40–4.50)

## 2024-05-11 ENCOUNTER — Ambulatory Visit: Payer: Self-pay | Admitting: Obstetrics and Gynecology

## 2024-05-11 DIAGNOSIS — G4730 Sleep apnea, unspecified: Secondary | ICD-10-CM

## 2024-05-13 LAB — CYTOLOGY - PAP: Diagnosis: NEGATIVE

## 2024-05-14 ENCOUNTER — Other Ambulatory Visit: Payer: Self-pay

## 2024-05-14 NOTE — Telephone Encounter (Signed)
 Spoke to patient. Number to [facility] given for BMD and advised her to call her previous gasto office for colonoscopy appointment.

## 2024-05-21 ENCOUNTER — Other Ambulatory Visit: Payer: Self-pay | Admitting: Obstetrics and Gynecology

## 2024-05-21 DIAGNOSIS — Z1331 Encounter for screening for depression: Secondary | ICD-10-CM

## 2024-05-21 DIAGNOSIS — Z01419 Encounter for gynecological examination (general) (routine) without abnormal findings: Secondary | ICD-10-CM

## 2024-05-21 DIAGNOSIS — N816 Rectocele: Secondary | ICD-10-CM

## 2024-05-21 DIAGNOSIS — Z1231 Encounter for screening mammogram for malignant neoplasm of breast: Secondary | ICD-10-CM

## 2024-05-21 DIAGNOSIS — R350 Frequency of micturition: Secondary | ICD-10-CM

## 2024-05-21 DIAGNOSIS — E2839 Other primary ovarian failure: Secondary | ICD-10-CM

## 2024-05-21 DIAGNOSIS — Z1504 Genetic susceptibility to malignant neoplasm of endometrium: Secondary | ICD-10-CM

## 2024-05-21 DIAGNOSIS — Z1211 Encounter for screening for malignant neoplasm of colon: Secondary | ICD-10-CM

## 2024-05-21 MED ORDER — UNABLE TO FIND: 0 refills | Status: DC

## 2024-05-26 NOTE — Telephone Encounter (Signed)
 Patient called asking for updated. She is aware Dr. Tia Flowers did see her message & it has been routed to the nursing supervisor to review.

## 2024-05-28 ENCOUNTER — Ambulatory Visit: Admitting: Obstetrics and Gynecology

## 2024-05-28 ENCOUNTER — Ambulatory Visit

## 2024-05-28 ENCOUNTER — Encounter: Payer: Self-pay | Admitting: Obstetrics and Gynecology

## 2024-05-28 ENCOUNTER — Ambulatory Visit: Payer: Self-pay | Admitting: Obstetrics and Gynecology

## 2024-05-28 VITALS — BP 120/78 | HR 64

## 2024-05-28 DIAGNOSIS — Z1504 Genetic susceptibility to malignant neoplasm of endometrium: Secondary | ICD-10-CM | POA: Diagnosis not present

## 2024-05-28 DIAGNOSIS — G4730 Sleep apnea, unspecified: Secondary | ICD-10-CM

## 2024-05-28 DIAGNOSIS — R102 Pelvic and perineal pain: Secondary | ICD-10-CM | POA: Diagnosis not present

## 2024-05-28 DIAGNOSIS — N3000 Acute cystitis without hematuria: Secondary | ICD-10-CM | POA: Diagnosis not present

## 2024-05-28 MED ORDER — NITROFURANTOIN MONOHYD MACRO 100 MG PO CAPS: 100.0000 mg | ORAL_CAPSULE | Freq: Two times a day (BID) | ORAL | 0 refills | Status: AC

## 2024-05-28 NOTE — Progress Notes (Signed)
   Acute Office Visit  Subjective:    Patient ID: Grace Wells, female    DOB: [birth year], 63 y.o.   MRN: [id number]   HPI 63 y.o. presents today for u/s & consult (U/s & consult//jj/Pt c/o pelvic pain ) . Sleep apnea and stops breathing on app about 25 times a night.  Needs replacement device. Letter written and she would benefit from this device.  PUS: sister with uterine cancer.  PUS today with 7.16cm uterus Normal ovaries Endometrial lining 2.44mm No adnexal masses No free fluid   Also, went to the beach this weekend and traveled in the car for a while and now feels like she has a bladder infection.  Voiding improves the LLQ pain. UA with 1+ LE No LMP recorded. Patient is postmenopausal.    Review of Systems     Objective:    OBGyn Exam  BP 120/78   Pulse 64   SpO2 99%  Wt Readings from Last 3 Encounters:  05/07/24 225 lb (102.1 kg)  02/20/22 239 lb (108.4 kg)  03/08/21 233 lb (105.7 kg)        Patient informed chaperone available to be present for breast and/or pelvic exam. Patient has requested no chaperone to be present. Patient has been advised what will be completed during breast and pelvic exam.   Assessment & Plan:  UTI Sister with endometrial cancer her for PUS results Severe sleep apnea  Macrobid  sent to her pharmacy for UTI Letter of benefit sent to [employer]. To get updated sleep study. Last one was 8 years ago. Counseled on PUS results and normal   30 minutes spent on reviewing records, imaging,  and one on one patient time and counseling patient and documentation Dr. Caro Christmas

## 2024-05-29 LAB — URINALYSIS, COMPLETE W/RFL CULTURE
Bilirubin Urine: NEGATIVE
Glucose, UA: NEGATIVE
Hgb urine dipstick: NEGATIVE
Hyaline Cast: NONE SEEN /LPF
Ketones, ur: NEGATIVE
Nitrites, Initial: NEGATIVE
Protein, ur: NEGATIVE
RBC / HPF: NONE SEEN /HPF (ref 0–2)
Specific Gravity, Urine: 1.003 (ref 1.001–1.035)
pH: 5.5 (ref 5.0–8.0)

## 2024-05-29 LAB — URINE CULTURE
MICRO NUMBER:: 16572691
Result:: NO GROWTH
SPECIMEN QUALITY:: ADEQUATE

## 2024-05-29 LAB — CULTURE INDICATED

## 2024-06-01 NOTE — Telephone Encounter (Signed)
 Per review of EPIC, letter sent to [employer] by Dr. Tia Flowers on 05/28/24

## 2024-06-02 NOTE — Progress Notes (Signed)
 Results and recommendations seen by patient.

## 2024-06-26 ENCOUNTER — Telehealth: Payer: Self-pay | Admitting: *Deleted

## 2024-06-26 NOTE — Telephone Encounter (Signed)
 Patient left message on refill line requesting update on status of sleep study and equipment.   Reviewed encounter dated 05/11/24.  Referral for sleep study placed 05/28/24, no location provides, referral not routed. Referral updated to [facility].  Dr. Glennon previously sent letter to [facility] on 05/28/24.    Spoke with patient advised as seen above.  Patient states she did get a call from [facility] to schedule sleep study and declined. Patient states she had a sleep study in the past at [facility], approximately 1-2 years ago, will have a copy of this faxed to the office for her chart. Referral cancelled.   Patient states she is waiting for order for equipment to be signed by Dr. Glennon. Advised of the importance of getting her to a provider that can manage her equipment and sleep apnea, not managed by GYN. Patient does not have a PCP. Discussed establishing care. Patient is going to reach out to [facility] who conducted her sleep study to see if they can also manage equipment and follow-up with me once she speaks with them. Advised I will provide update to Dr. Glennon. Patient agreeable.   Routing for review.

## 2024-07-17 ENCOUNTER — Encounter: Payer: Self-pay | Admitting: Obstetrics and Gynecology

## 2024-07-31 NOTE — Telephone Encounter (Signed)
 This was discussed and matter taken care of KD

## 2024-08-21 ENCOUNTER — Ambulatory Visit: Admitting: Obstetrics and Gynecology

## 2024-10-23 ENCOUNTER — Ambulatory Visit: Admitting: Obstetrics and Gynecology

## 2024-11-24 LAB — HM MAMMOGRAPHY

## 2024-11-26 ENCOUNTER — Encounter: Payer: Self-pay | Admitting: Obstetrics and Gynecology

## 2024-11-26 ENCOUNTER — Ambulatory Visit: Payer: Self-pay | Admitting: Obstetrics and Gynecology

## 2024-12-01 ENCOUNTER — Encounter: Payer: Self-pay | Admitting: Gastroenterology

## 2024-12-28 ENCOUNTER — Encounter: Payer: Self-pay | Admitting: *Deleted

## 2025-01-07 ENCOUNTER — Encounter: Payer: Self-pay | Admitting: Gastroenterology

## 2025-01-07 ENCOUNTER — Ambulatory Visit: Admitting: Gastroenterology

## 2025-01-07 VITALS — BP 116/80 | HR 69 | Ht 64.25 in | Wt 220.2 lb

## 2025-01-07 DIAGNOSIS — Z800 Family history of malignant neoplasm of digestive organs: Secondary | ICD-10-CM

## 2025-01-07 DIAGNOSIS — Z8719 Personal history of other diseases of the digestive system: Secondary | ICD-10-CM

## 2025-01-07 DIAGNOSIS — R1310 Dysphagia, unspecified: Secondary | ICD-10-CM | POA: Diagnosis not present

## 2025-01-07 DIAGNOSIS — Z860100 Personal history of colon polyps, unspecified: Secondary | ICD-10-CM

## 2025-01-07 DIAGNOSIS — R1319 Other dysphagia: Secondary | ICD-10-CM

## 2025-01-07 MED ORDER — NA SULFATE-K SULFATE-MG SULF 17.5-3.13-1.6 GM/177ML PO SOLN: 1.0000 | Freq: Once | ORAL | 0 refills | Status: AC

## 2025-01-07 NOTE — Progress Notes (Signed)
 "  Chief Complaint:discuss EGD/colon Primary GI Doctor:( previously Dr. Teressa) Dr. Charlanne   HPI:  Patient is a  64  year old female patient with past medical history of GERD,anxiety, history of colonic polyps, who was self referred to me for a evaluation of EGD/colon .    Interval History Patient presents for evaluation of eso dysphagia and colon screening colonoscopy.  Patient reports esophageal dysphagia intermittently with meats and large pills. Patient denies any symptoms of GERD. Good appetite.  Patient denies altered bowel habits, abdominal pain, or rectal bleeding.  Not on blood thinners.   Nonsmoker. She drinks occasional, 1-2 drinks over 6 mths or more.   Surgical history: none   Patient's family history includes: paternal GM with with colon CA  Wt Readings from Last 3 Encounters:  01/07/25 220 lb 4 oz (99.9 kg)  05/07/24 225 lb (102.1 kg)  02/20/22 239 lb (108.4 kg)    Past Medical History:  Diagnosis Date   Anxiety    ASCUS of cervix with negative high risk HPV 03/2017, 07/2019   Cervical dysplasia    LGSIL C&B 6/08, LGSIL pap12/09 & 6/10, WNL after   Diverticulitis    Fatty liver 07/2012   mild elevated LFTs   GERD (gastroesophageal reflux disease)    Obesity    Psoriasis    Rectocele 2014    Past Surgical History:  Procedure Laterality Date   COLONOSCOPY     COLPOSCOPY     ROOT CANAL     UPPER GASTROINTESTINAL ENDOSCOPY      Current Outpatient Medications  Medication Sig Dispense Refill   Ascorbic Acid (VITAMIN C PO) Take by mouth daily.     Cholecalciferol (VITAMIN D3) 50 MCG (2000 UT) TABS Take by mouth daily.     Multiple Vitamin (MULTIVITAMIN) tablet Take 1 tablet by mouth daily.     Multiple Vitamins-Minerals (HAIR/SKIN/NAILS/BIOTIN PO) Take 1 capsule by mouth daily.     Multiple Vitamins-Minerals (ZINC PO) Take 100 mg by mouth daily.     Na Sulfate-K Sulfate-Mg Sulfate concentrate (SUPREP) 17.5-3.13-1.6 GM/177ML SOLN Take 1 kit (354 mLs  total) by mouth once for 1 dose. 354 mL 0   Prasterone  (INTRAROSA ) 6.5 MG INST Place 1 suppository vaginally at bedtime as needed. 30 each 12   vitamin B-12 (CYANOCOBALAMIN) 1000 MCG tablet Take 1,000 mcg by mouth daily.     nitrofurantoin , macrocrystal-monohydrate, (MACROBID ) 100 MG capsule Take 1 capsule (100 mg total) by mouth 2 (two) times daily. (Patient not taking: Reported on 01/07/2025) 14 capsule 0   No current facility-administered medications for this visit.    Allergies as of 01/07/2025 - Review Complete 01/07/2025  Allergen Reaction Noted   Codeine Nausea And Vomiting    Penicillins      Family History  Problem Relation Age of Onset   Diabetes Maternal Grandmother    Emphysema Maternal Grandmother    Colon cancer Paternal Grandmother    Heart disease Paternal Grandfather        stroke   Stroke Paternal Grandfather    Cancer Mother        Lymphoma   Lung cancer Father        smoker   Hypertension Brother    Cancer Maternal Grandfather        Hodgkins   Esophageal cancer Neg Hx    Rectal cancer Neg Hx    Stomach cancer Neg Hx     Review of Systems:    Constitutional: No weight loss, fever,  chills, weakness or fatigue HEENT: Eyes: No change in vision               Ears, Nose, Throat:  No change in hearing or congestion Skin: No rash or itching Cardiovascular: No chest pain, chest pressure or palpitations   Respiratory: No SOB or cough Gastrointestinal: See HPI and otherwise negative Genitourinary: No dysuria or change in urinary frequency Neurological: No headache, dizziness or syncope Musculoskeletal: No new muscle or joint pain Hematologic: No bleeding or bruising Psychiatric: No history of depression or anxiety    Physical Exam:  Vital signs: BP 116/80 (BP Location: Left Arm, Patient Position: Sitting, Cuff Size: Normal)   Pulse 69   Ht 5' 4.25 (1.632 m)   Wt 220 lb 4 oz (99.9 kg)   SpO2 96%   BMI 37.51 kg/m   Constitutional:   Pleasant female  appears to be in NAD, Well developed, Well nourished, alert and cooperative Eyes:   PEERL, EOMI. No icterus. Conjunctiva pink. Neck:  Supple Throat: Oral cavity and pharynx without inflammation, swelling or lesion.  Respiratory: Respirations even and unlabored. Lungs clear to auscultation bilaterally.   No wheezes, crackles, or rhonchi.  Cardiovascular: Normal S1, S2. Regular rate and rhythm. No peripheral edema, cyanosis or pallor.  Gastrointestinal:  Soft, nondistended, nontender. No rebound or guarding. Normal bowel sounds. No appreciable masses or hepatomegaly. Rectal:  Not performed.  Msk:  Symmetrical without gross deformities. Without edema, no deformity or joint abnormality.  Neurologic:  Alert and  oriented x4;  grossly normal neurologically.  Skin:   Dry and intact without significant lesions or rashes.  RELEVANT LABS AND IMAGING: CBC    Latest Ref Rng & Units 05/07/2024    4:01 PM 02/20/2022    3:35 PM 01/17/2021   11:55 AM  CBC  WBC 3.8 - 10.8 Thousand/uL 6.1  5.3  4.1   Hemoglobin 11.7 - 15.5 g/dL 86.0  85.7  85.3   Hematocrit 35.0 - 45.0 % 41.9  42.5  43.9   Platelets 140 - 400 Thousand/uL 182  184  150      CMP     Latest Ref Rng & Units 05/07/2024    4:01 PM 02/20/2022    3:35 PM 03/08/2021    3:41 PM  CMP  Glucose 65 - 99 mg/dL 86  82    BUN 7 - 25 mg/dL 10  10    Creatinine 9.49 - 1.05 mg/dL 9.41  9.32    Sodium 864 - 146 mmol/L 140  141    Potassium 3.5 - 5.3 mmol/L 4.6  4.5    Chloride 98 - 110 mmol/L 104  104    CO2 20 - 32 mmol/L 28  27    Calcium 8.6 - 10.4 mg/dL 9.5  9.6    Total Protein 6.1 - 8.1 g/dL 6.8  7.0    Total Bilirubin 0.2 - 1.2 mg/dL 1.1  1.1    AST 10 - 35 U/L 26  26  27    ALT 6 - 29 U/L 25  25  28       Lab Results  Component Value Date   TSH 1.77 05/07/2024     GI procedure:  02/2019 EGD/colonoscopy with Dr. Teressa  EGD - Benign- appearing esophageal stenosis. Dilated. - Small hiatal hernia. - The examination was otherwise  normal.  Colonoscopy, recall 3 years - One 12 mm polyp in the cecum, removed with a hot snare. Resected and retrieved. - The examination was otherwise  normal on direct and retroflexion views. Path: Diagnosis Surgical [P], colon, cecum, polyp - TUBULAR ADENOMA(S). - HIGH GRADE DYSPLASIA OR MALIGNANCY.    Assessment: Encounter Diagnoses  Name Primary?   History of colonic polyps Yes   Family history of colon cancer    Esophageal dysphagia    History of esophageal stricture    64 year old female patient who presents with eso dysphagia and to discuss recall colonoscopy. She has hx of esophageal stricture that required dilatation for dysphagia which helped. Will go ahead and schedule repeat EGD with dilatation in [facility] with Dr. Charlanne.    Patient has history of colonic polyps, overdue for colon screening colonoscopy, will go ahead and schedule colon screening colonoscopy in [facility] with Dr. Charlanne.   Plan: -Schedule for a colonoscopy in [facility] with Dr. Charlanne. The risks and benefits of colonoscopy with possible polypectomy / biopsies were discussed and the patient agrees to proceed.  -Schedule EGD with possible dilatation in [facility] with Dr. Charlanne. The risks and benefits of EGD with possible biopsies and esophageal dilation were discussed with the patient who agrees to proceed.  Thank you for the courtesy of this consult. Please call me with any questions or concerns.   Daphna Lafuente, FNP-C [facility] 01/07/2025, 9:14 AM  Cc: No ref. provider found  "

## 2025-01-07 NOTE — Patient Instructions (Addendum)
 Dysphagia  Precautions in pamphlet given  We have sent the following medications to your pharmacy for you to pick up at your convenience: SUPREP  You have been scheduled for an endoscopy and colonoscopy. Please follow the written instructions given to you at your visit today.  If you use inhalers (even only as needed), please bring them with you on the day of your procedure.  DO NOT TAKE 7 DAYS PRIOR TO TEST- Trulicity (dulaglutide) Ozempic, Wegovy (semaglutide) Mounjaro, Zepbound (tirzepatide) Bydureon Bcise (exanatide extended release)  DO NOT TAKE 1 DAY PRIOR TO YOUR TEST Rybelsus (semaglutide) Adlyxin (lixisenatide) Victoza (liraglutide) Byetta (exanatide) ___________________________________________________________________________  Due to recent changes in healthcare laws, you may see the results of your imaging and laboratory studies on MyChart before your provider has had a chance to review them.  We understand that in some cases there may be results that are confusing or concerning to you. Not all laboratory results come back in the same time frame and the provider may be waiting for multiple results in order to interpret others.  Please give us  48 hours in order for your provider to thoroughly review all the results before contacting the office for clarification of your results.   _______________________________________________________  If your blood pressure at your visit was 140/90 or greater, please contact your primary care physician to follow up on this.  _______________________________________________________  If you are age 62 or older, your body mass index should be between 23-30. Your Body mass index is 37.51 kg/m. If this is out of the aforementioned range listed, please consider follow up with your Primary Care Provider.  If you are age 32 or younger, your body mass index should be between 19-25. Your Body mass index is 37.51 kg/m. If this is out of the  aformentioned range listed, please consider follow up with your Primary Care Provider.   ________________________________________________________  The [facility] providers would like to encourage you to use MYCHART to communicate with providers for non-urgent requests or questions.  Due to long hold times on the telephone, sending your provider a message by [facility] may be a faster and more efficient way to get a response.  Please allow 48 business hours for a response.  Please remember that this is for non-urgent requests.  _______________________________________________________  [facility] is using a team-based approach to care.  Your team is made up of your doctor and two to three APPS. Our APPS (Nurse Practitioners and Physician Assistants) work with your physician to ensure care continuity for you. They are fully qualified to address your health concerns and develop a treatment plan. They communicate directly with your gastroenterologist to care for you. Seeing the Advanced Practice Practitioners on your physician's team can help you by facilitating care more promptly, often allowing for earlier appointments, access to diagnostic testing, procedures, and other specialty referrals.   Thank you for trusting me with your gastrointestinal care. Deanna May, FNP-C

## 2025-02-03 ENCOUNTER — Encounter: Admitting: Gastroenterology
